# Patient Record
Sex: Female | Born: 1945 | Race: White | Hispanic: No | Smoking: Never smoker
Health system: Southern US, Community
[De-identification: ages and names within clinical notes are randomized; demographics above are authoritative.]

## PROBLEM LIST (undated history)

## (undated) DIAGNOSIS — I7389 Other specified peripheral vascular diseases: Secondary | ICD-10-CM

## (undated) DIAGNOSIS — E039 Hypothyroidism, unspecified: Secondary | ICD-10-CM

## (undated) DIAGNOSIS — R0789 Other chest pain: Secondary | ICD-10-CM

## (undated) DIAGNOSIS — E7800 Pure hypercholesterolemia, unspecified: Secondary | ICD-10-CM

## (undated) DIAGNOSIS — K219 Gastro-esophageal reflux disease without esophagitis: Secondary | ICD-10-CM

---
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
## 1997-07-25 ENCOUNTER — Ambulatory Visit (HOSPITAL_COMMUNITY): Admission: RE | Admit: 1997-07-25 | Discharge: 1997-07-25 | Payer: Self-pay | Admitting: *Deleted

## 1997-09-01 ENCOUNTER — Ambulatory Visit (HOSPITAL_BASED_OUTPATIENT_CLINIC_OR_DEPARTMENT_OTHER): Admission: RE | Admit: 1997-09-01 | Discharge: 1997-09-01 | Payer: Self-pay | Admitting: Surgery

## 1997-09-25 ENCOUNTER — Ambulatory Visit (HOSPITAL_COMMUNITY): Admission: RE | Admit: 1997-09-25 | Discharge: 1997-09-26 | Payer: Self-pay | Admitting: Surgery

## 1998-02-03 ENCOUNTER — Ambulatory Visit (HOSPITAL_COMMUNITY): Admission: RE | Admit: 1998-02-03 | Discharge: 1998-02-03 | Payer: Self-pay | Admitting: *Deleted

## 1998-08-26 ENCOUNTER — Observation Stay (HOSPITAL_COMMUNITY): Admission: RE | Admit: 1998-08-26 | Discharge: 1998-08-27 | Payer: Self-pay | Admitting: Urology

## 1998-10-01 ENCOUNTER — Encounter: Payer: Self-pay | Admitting: *Deleted

## 1998-10-01 ENCOUNTER — Ambulatory Visit (HOSPITAL_COMMUNITY): Admission: RE | Admit: 1998-10-01 | Discharge: 1998-10-01 | Payer: Self-pay | Admitting: *Deleted

## 1999-10-04 ENCOUNTER — Ambulatory Visit (HOSPITAL_COMMUNITY): Admission: RE | Admit: 1999-10-04 | Discharge: 1999-10-04 | Payer: Self-pay | Admitting: *Deleted

## 1999-10-04 ENCOUNTER — Encounter: Payer: Self-pay | Admitting: *Deleted

## 1999-12-25 ENCOUNTER — Encounter: Admission: RE | Admit: 1999-12-25 | Discharge: 1999-12-25 | Payer: Self-pay | Admitting: Internal Medicine

## 1999-12-25 ENCOUNTER — Encounter: Payer: Self-pay | Admitting: Internal Medicine

## 2000-10-16 ENCOUNTER — Ambulatory Visit (HOSPITAL_COMMUNITY): Admission: RE | Admit: 2000-10-16 | Discharge: 2000-10-16 | Payer: Self-pay | Admitting: *Deleted

## 2000-10-16 ENCOUNTER — Encounter: Payer: Self-pay | Admitting: *Deleted

## 2000-11-17 ENCOUNTER — Encounter: Admission: RE | Admit: 2000-11-17 | Discharge: 2000-11-17 | Payer: Self-pay | Admitting: Internal Medicine

## 2000-11-17 ENCOUNTER — Encounter: Payer: Self-pay | Admitting: Internal Medicine

## 2000-12-13 ENCOUNTER — Observation Stay (HOSPITAL_COMMUNITY): Admission: RE | Admit: 2000-12-13 | Discharge: 2000-12-13 | Payer: Self-pay | Admitting: Neurosurgery

## 2000-12-13 ENCOUNTER — Encounter: Payer: Self-pay | Admitting: Neurosurgery

## 2001-11-07 ENCOUNTER — Encounter: Admission: RE | Admit: 2001-11-07 | Discharge: 2001-11-07 | Payer: Self-pay | Admitting: Internal Medicine

## 2001-11-07 ENCOUNTER — Encounter: Payer: Self-pay | Admitting: Internal Medicine

## 2001-12-17 ENCOUNTER — Inpatient Hospital Stay (HOSPITAL_COMMUNITY): Admission: RE | Admit: 2001-12-17 | Discharge: 2001-12-20 | Payer: Self-pay | Admitting: Neurosurgery

## 2001-12-17 ENCOUNTER — Encounter: Payer: Self-pay | Admitting: Neurosurgery

## 2002-01-17 ENCOUNTER — Encounter: Payer: Self-pay | Admitting: Neurosurgery

## 2002-01-17 ENCOUNTER — Encounter: Admission: RE | Admit: 2002-01-17 | Discharge: 2002-01-17 | Payer: Self-pay | Admitting: Neurosurgery

## 2002-04-04 ENCOUNTER — Encounter: Admission: RE | Admit: 2002-04-04 | Discharge: 2002-04-04 | Payer: Self-pay | Admitting: Neurosurgery

## 2002-04-04 ENCOUNTER — Encounter: Payer: Self-pay | Admitting: Neurosurgery

## 2003-07-14 ENCOUNTER — Encounter: Admission: RE | Admit: 2003-07-14 | Discharge: 2003-07-14 | Payer: Self-pay | Admitting: Neurosurgery

## 2004-07-29 ENCOUNTER — Ambulatory Visit (HOSPITAL_COMMUNITY): Admission: RE | Admit: 2004-07-29 | Discharge: 2004-07-29 | Payer: Self-pay | Admitting: Internal Medicine

## 2004-08-11 ENCOUNTER — Encounter: Admission: RE | Admit: 2004-08-11 | Discharge: 2004-08-11 | Payer: Self-pay | Admitting: Internal Medicine

## 2004-08-26 ENCOUNTER — Ambulatory Visit (HOSPITAL_COMMUNITY): Admission: RE | Admit: 2004-08-26 | Discharge: 2004-08-26 | Payer: Self-pay | Admitting: Orthopedic Surgery

## 2004-08-26 ENCOUNTER — Ambulatory Visit (HOSPITAL_BASED_OUTPATIENT_CLINIC_OR_DEPARTMENT_OTHER): Admission: RE | Admit: 2004-08-26 | Discharge: 2004-08-27 | Payer: Self-pay | Admitting: Orthopedic Surgery

## 2004-08-27 ENCOUNTER — Encounter (INDEPENDENT_AMBULATORY_CARE_PROVIDER_SITE_OTHER): Payer: Self-pay | Admitting: Specialist

## 2005-11-30 ENCOUNTER — Encounter: Admission: RE | Admit: 2005-11-30 | Discharge: 2005-11-30 | Payer: Self-pay | Admitting: Internal Medicine

## 2006-01-03 ENCOUNTER — Encounter: Admission: RE | Admit: 2006-01-03 | Discharge: 2006-01-03 | Payer: Self-pay | Admitting: Internal Medicine

## 2006-01-26 ENCOUNTER — Encounter: Admission: RE | Admit: 2006-01-26 | Discharge: 2006-01-26 | Payer: Self-pay | Admitting: Internal Medicine

## 2006-06-15 ENCOUNTER — Ambulatory Visit (HOSPITAL_COMMUNITY): Admission: RE | Admit: 2006-06-15 | Discharge: 2006-06-15 | Payer: Self-pay | Admitting: Internal Medicine

## 2007-01-23 ENCOUNTER — Encounter: Admission: RE | Admit: 2007-01-23 | Discharge: 2007-01-23 | Payer: Self-pay | Admitting: Neurosurgery

## 2007-01-26 ENCOUNTER — Encounter: Admission: RE | Admit: 2007-01-26 | Discharge: 2007-01-26 | Payer: Self-pay | Admitting: Neurosurgery

## 2007-03-20 ENCOUNTER — Inpatient Hospital Stay (HOSPITAL_COMMUNITY): Admission: RE | Admit: 2007-03-20 | Discharge: 2007-03-22 | Payer: Self-pay | Admitting: Neurosurgery

## 2007-04-03 ENCOUNTER — Encounter: Admission: RE | Admit: 2007-04-03 | Discharge: 2007-04-03 | Payer: Self-pay | Admitting: Neurosurgery

## 2007-07-20 ENCOUNTER — Encounter: Admission: RE | Admit: 2007-07-20 | Discharge: 2007-07-20 | Payer: Self-pay | Admitting: Neurosurgery

## 2008-10-16 IMAGING — CR DG CHEST 2V
2 series · 2 of 2 positions shown · non-contrast
Comparison: None

This report is delayed due to PACS failure.
CLINICAL DATA: 61-year-old female, degenerative disk disease.  Preop for surgery.
 CHEST ? 2 VIEW ? 03/20/07:

[view not recorded (1 of 2)]
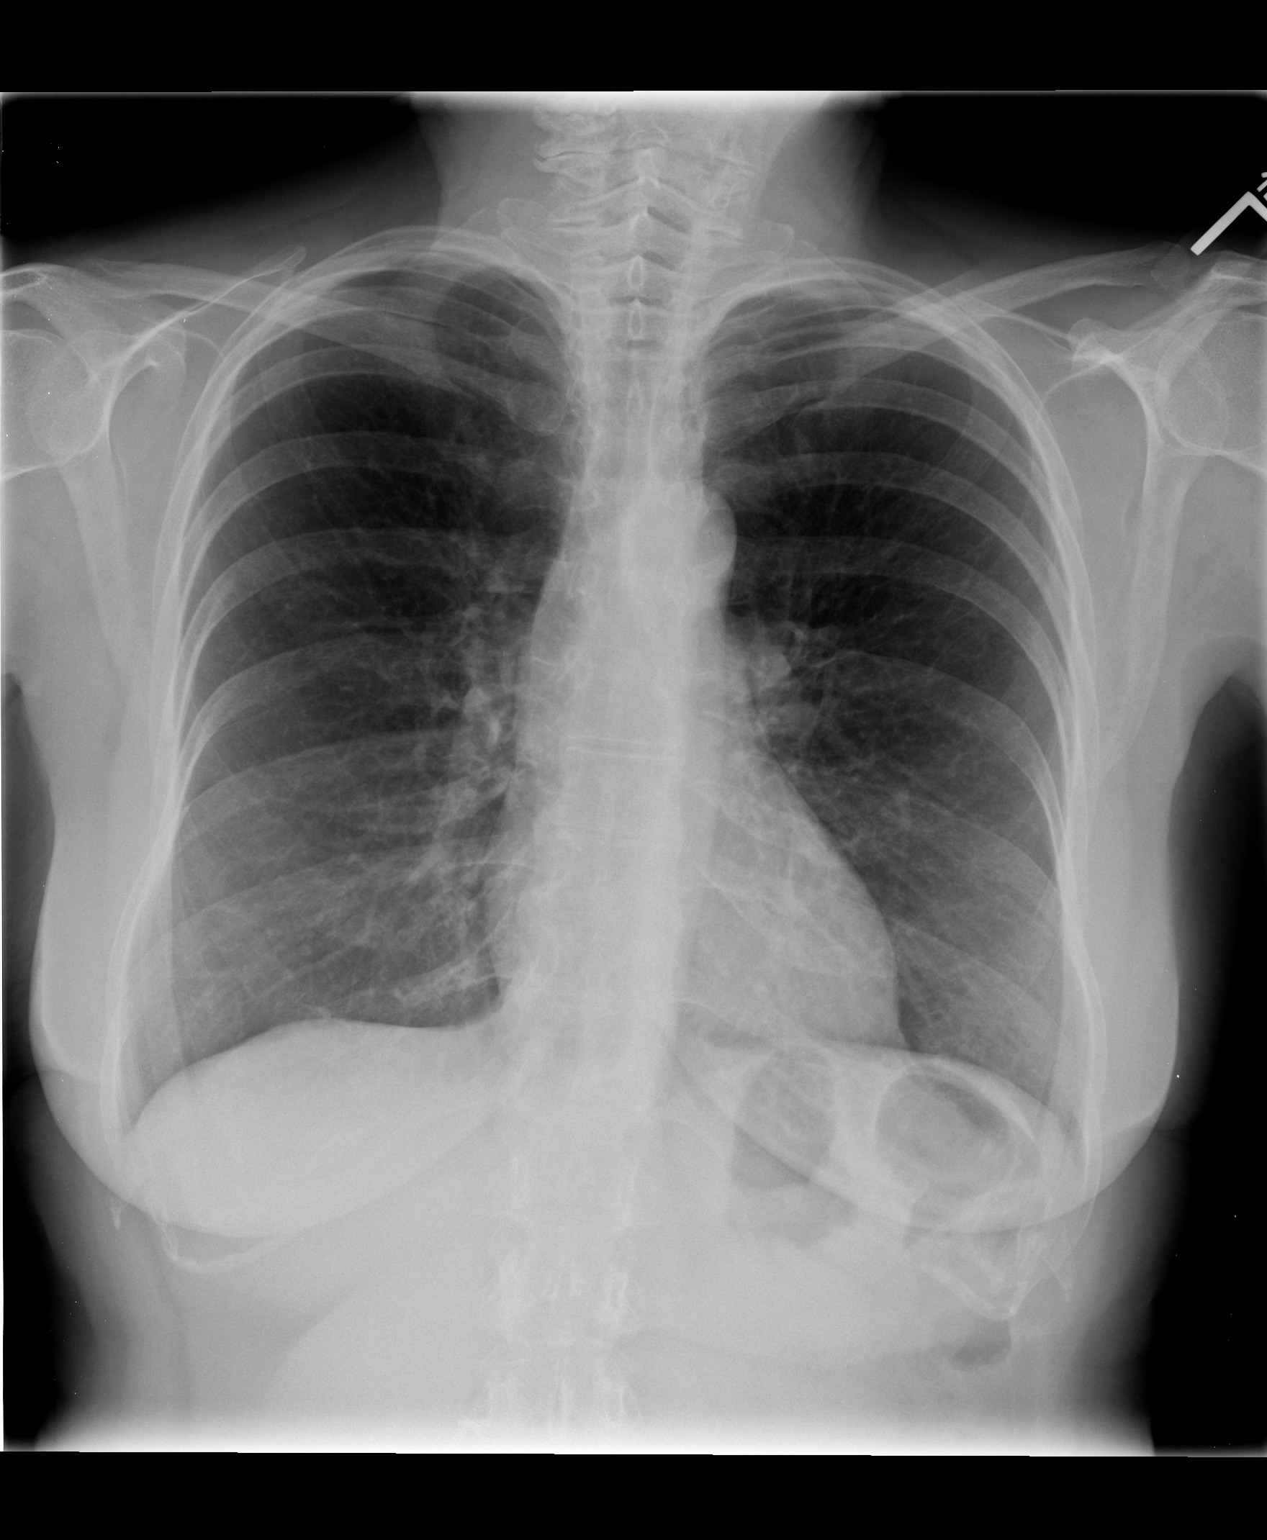

[view not recorded (2 of 2)]
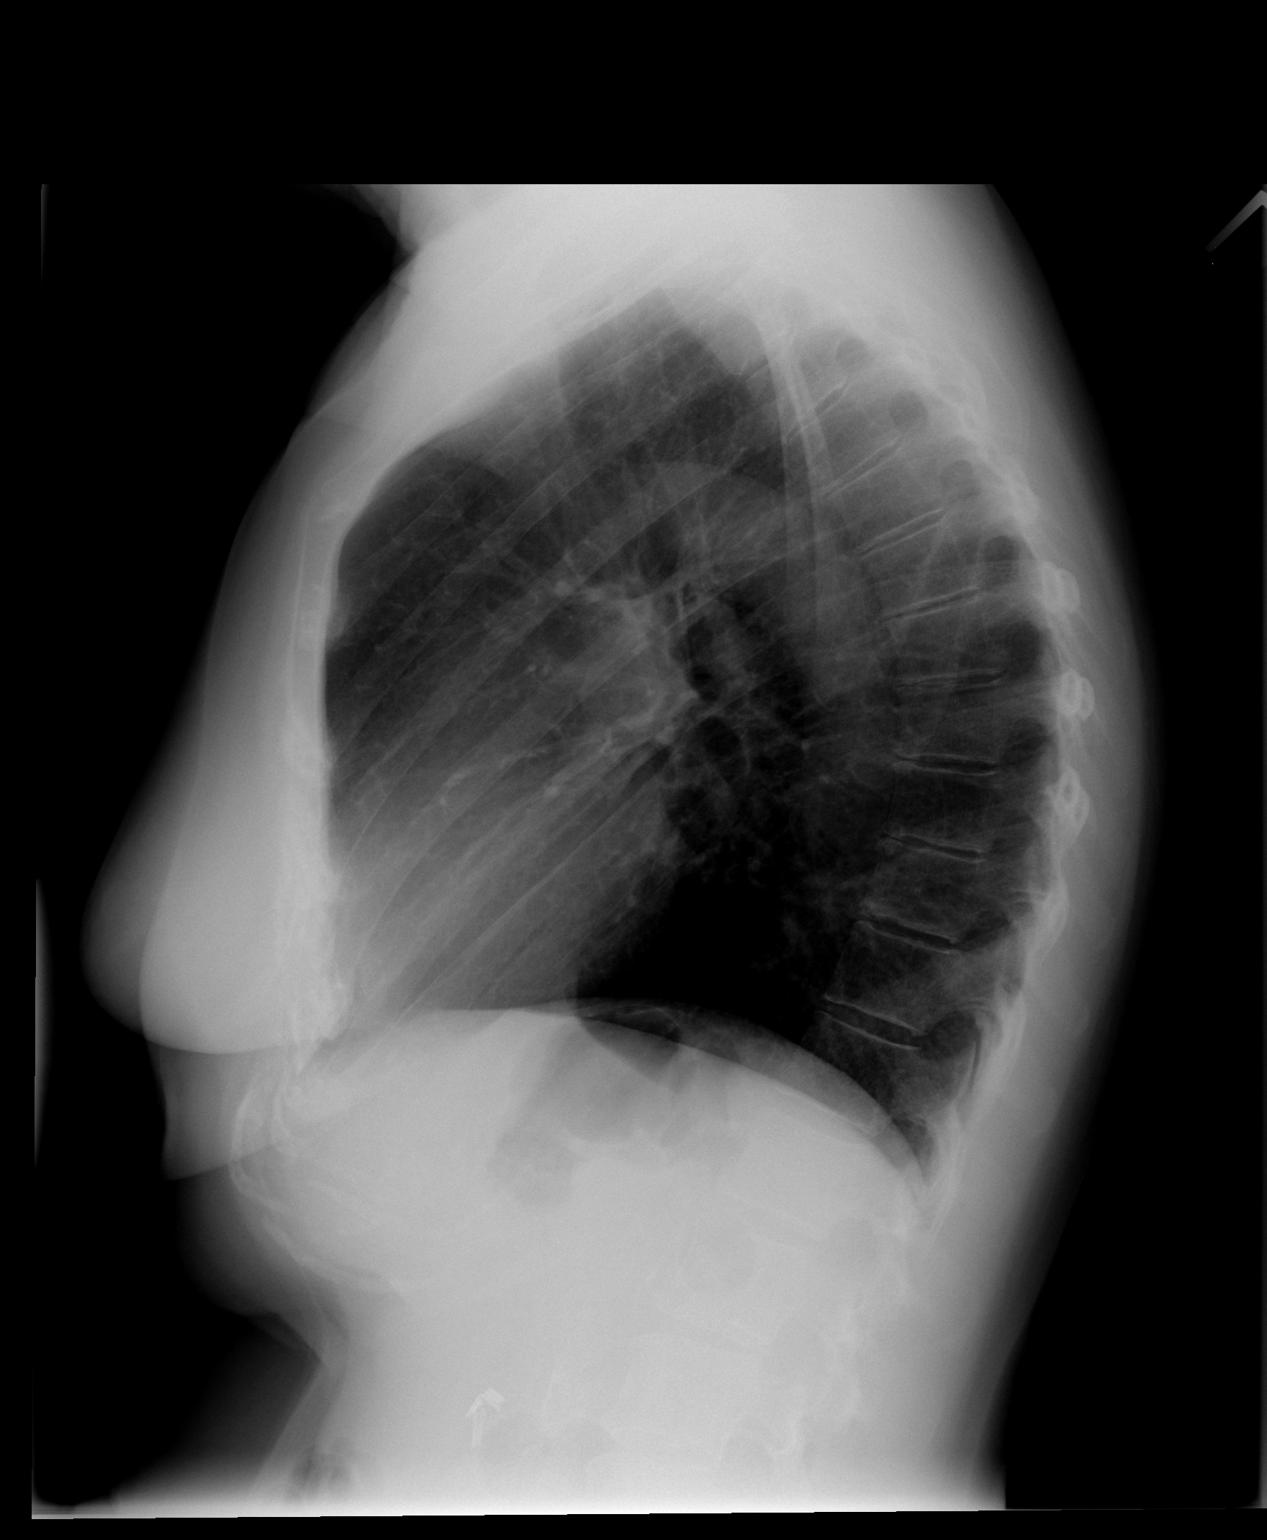

[2 of 2 positions shown; findings below may reference images not displayed]

FINDINGS: Cardio-pericardial silhouette is within normal limits for size.  The lungs are mildly hyperexpanded in the AP dimension.  No focal airspace disease present.  The visualized soft tissues and bony thorax are unremarkable.  The patient is status post cholecystectomy.
IMPRESSION: 1.  Mild COPD.
 2.  No acute cardiopulmonary disease.

## 2008-10-16 IMAGING — CR DG CHEST 1V PORT
1 series · 1 of 1 positions shown · non-contrast
Comparison: Earlier that day.

This report is delayed due to PACS failure.
CLINICAL DATA: Central line placement. 
 PORTABLE CHEST - 1 VIEW ? 03/20/07 AT 5556 HOURS:

[view not recorded]
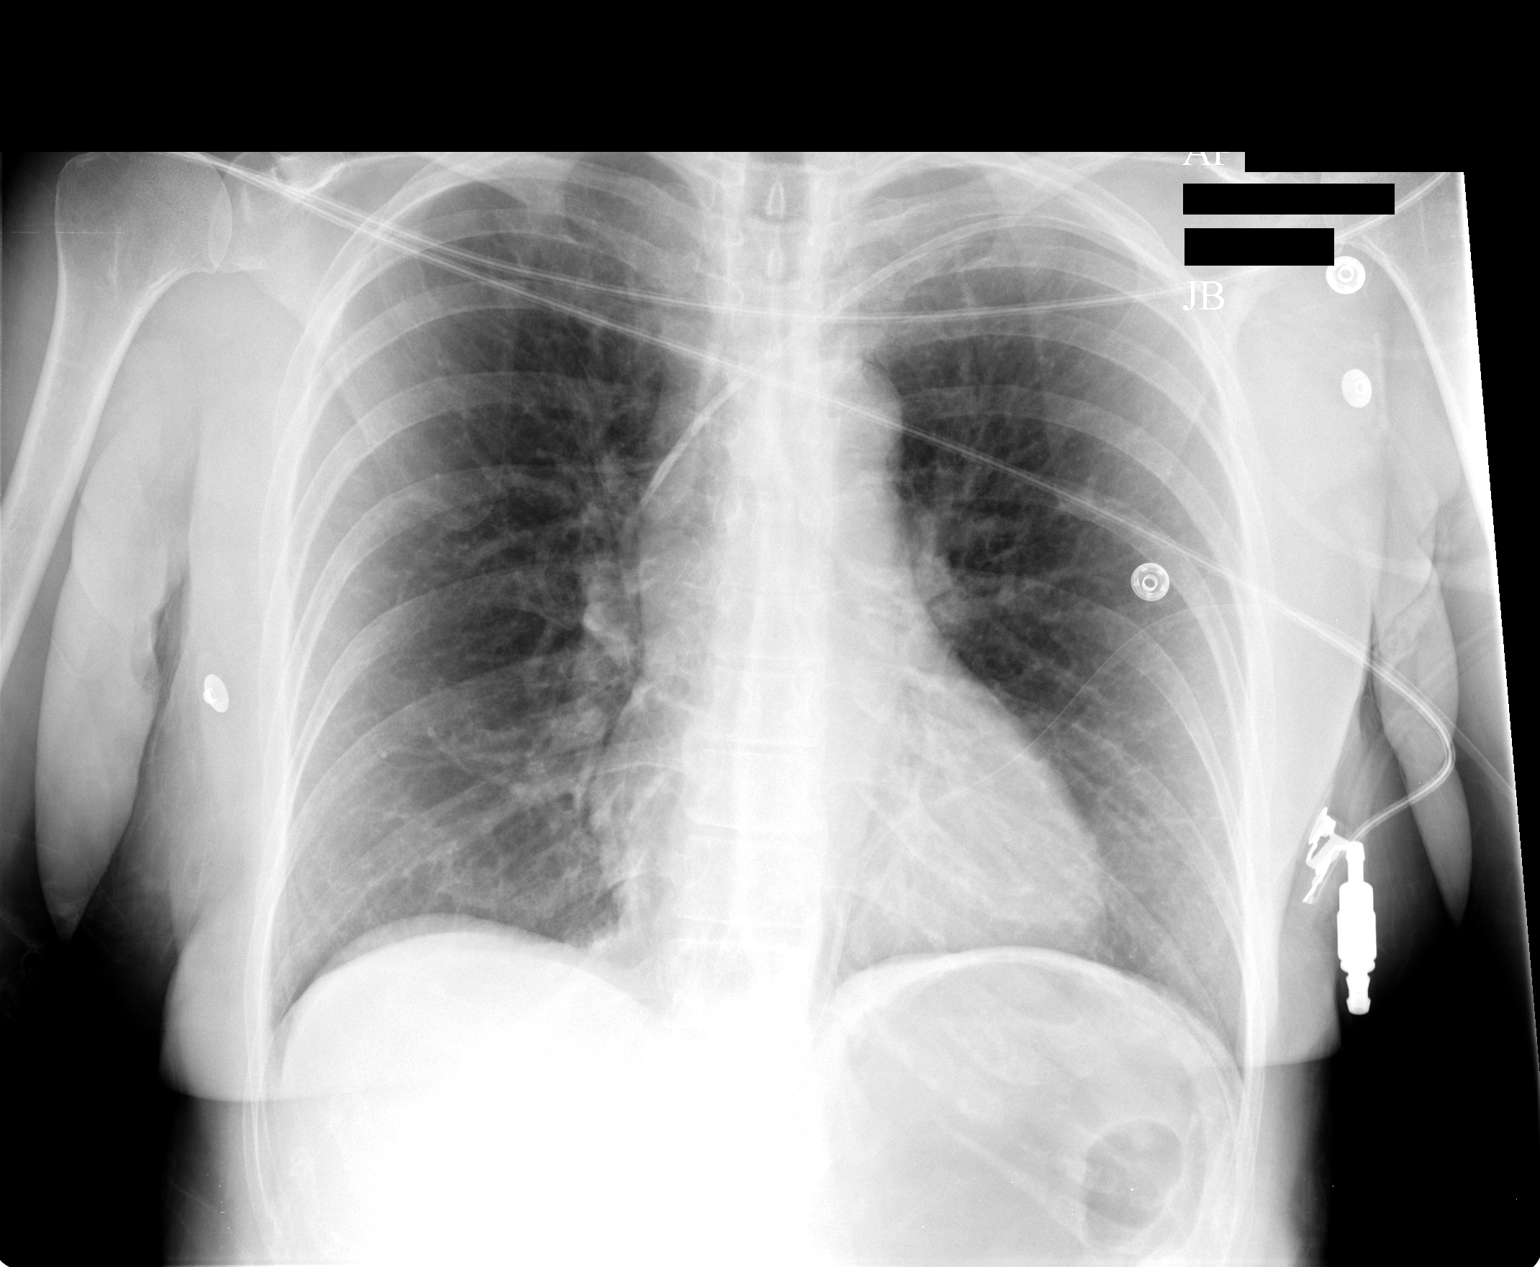

[1 of 1 positions shown; findings below may reference images not displayed]

FINDINGS: A left central venous line is present with the tip in the mid-SVC.  No pneumothorax is seen.  The lungs are clear.   The heart is within normal limits in size.
IMPRESSION: Left central venous catheter tip in mid-SVC.  No pneumothorax.

## 2008-11-03 ENCOUNTER — Ambulatory Visit (HOSPITAL_COMMUNITY): Admission: RE | Admit: 2008-11-03 | Discharge: 2008-11-03 | Payer: Self-pay | Admitting: Internal Medicine

## 2010-04-11 ENCOUNTER — Encounter: Payer: Self-pay | Admitting: Internal Medicine

## 2010-08-03 NOTE — Op Note (Signed)
Kimberly Rosario, Kimberly Rosario              ACCOUNT NO.:  [id number]   MEDICAL RECORD NO.:  [id number]          PATIENT TYPE:  INP   LOCATION:  3172                         FACILITY:  [facility]   PHYSICIAN:  Kathaleen Maser. Pool, M.D.    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  Status post L5-S1 decompression and fusion with  instrumentation.   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  Status post L5-S1 decompression and fusion with  instrumentation.   PROCEDURE NOTE:  Re-exploration of L5-S1 fusion with removal of L5-S1  instrumentation.  L4-5 redo decompressive laminectomy with bilateral L4  and L5 decompressive foraminotomies, more than would be required for  simple interbody fusion alone.  L4-5 posterior lumbar body fusion  utilizing tangent interbody allograft wedge, __________ interbody PEEK  cage and local autografting.  L4-5 posterolateral arthrodesis utilizing  nonsegmental pedicle sensation and local autograft.   SURGEON:  Kathaleen Maser. Pool, M.D.   ASSISTANT SURGEONS:  Reinaldo Meeker, M.D.   ANESTHESIA:  General endotracheal.   INDICATIONS FOR PROCEDURE:  The patient is a 65 year old female with  history of progressive back and bilateral lower extremity pain failing  all conservative management.  The patient is status post previous L5-S1  decompression and fusion for spondylolisthesis with reasonably good  results.  The patient has evidence of segmental breakdown at the L4-5  level with resultant stenosis and instability.  The patient presents now  for L4-5 decompression and fusion with instrumentation.  This will  require re-exploring her L5-S1 fusion and removing hardware at this  level.  The patient is aware of the risks and benefits and wishes to  proceed.   DESCRIPTION  OF PROCEDURE:  The patient was placed in the supine  position.  After a level of anesthesia was achieved the patient was  placed prone onto the Wilson frame, appropriate padding placed.  The  lumbar area was then prepped and draped in the usual sterile fashion.  Made a 10 cm linear  incision overlying the L4-5 and S1 levels.  This  was carried down sharply in the midline.  Subperiosteal dissection was  performed exposing lamina of L3-L4, as well as the previous  posterolateral fusion at L5-S1 and its instrumentation.  Deep self-  retaining retractor was placed.  Intraoperative fluoroscopy was used.  Levels were confirmed.  The instrumentation L5-S1 was disconnected.  All  parts were found to be solid.  The fusion itself was explored and found  to be quite solid.  With this in mind the S1 screws were no longer  necessary and these were removed.  Attention was then placed to the L4-5  level.  Using dental instruments and careful dissection the previous  laminectomy defect was dissected free.  Complete laminectomy of L4 was  then undertaken using Leksell rongeurs, Kerrison rongeurs and a high-  speed drill.  All elements of the lamina of L4 were removed.  Inferior  facetectomies was performed at L4 bilaterally.  Superior facetectomies  at five were performed bilaterally.  Wide decompressive foraminotomies  were then performed along the course of exiting L4-L5 nerve roots  bilaterally.  All bone is cleaned and used in later autografting.  Epidural venous plexus was coagulated and cut.  Starting first on the  patient's right side the thecal sac and nerve roots were gently  mobilized and tracked towards the midline.  Disk space was incised with  a 15 blade, retracted the fascia.  Wide disk space clean-out was  achieved.  Using pituitary rongeurs __________ and Epstein curettes.  All elements of the disk were removed at this level on the right side.  The procedure was then repeated on the  patient's left side.  The disc  space was then sequentially dilated up to 8 mm and an 8-mm distractor  was left on the patient's right side.  Thecal sac nerve roots protected  on the left side.  The disk space was then reamed and cut with 8-mm  tangent instruments on the left side.  Soft tissues were removed from  the interspace.  An 8 x 26-mm tangent wedge was then impacted into place  and recessed approximately 2 mm from the posterior cortical  margin of  L4.  Distractors were removed from the patient's right side.  Thecal sac  nerve was checked on the right side.  Disk space was then reamed and cut  with 8-mm tangent instruments on the right side.  Soft tissue was  removed from the interspace.  Disk space further curettaged.  Morselized  autograft mixed with__________  putty was then packed in the interspace.  An 8 x 22 mm, __________ cage packed with morselized autograft was then  packed into place and recessed approximately 2 mm of the posterior  cortical margin of L4.  The pedicles of L4 were then identified  bilaterally using surface landmarks and intraoperative fluoroscopy.  Superficial bone around the pedicles was then removed using a high-speed  drill.  Each pedicle was then probed using pedicle awl.  The pedicle awl  track was then tapped with a 5.25-mm screw tap.  Each screw tap hole was  probed and found to be in solid bone.  A 6.75 x 45-mm spiral 90 D screws  were placed bilaterally at L4.  Transverse processes at L4 and L5 were  then decorticated using a high-speed drill.  Morselized autograft was  then packed posterolaterally for later fusion.  Short segment titanium  rods were then placed over the screw heads at L4-L5.  Locking caps were  then placed over the screw heads and the locking caps were given a final  tightening with construct under compression.  Final images revealed good  position of bone grafts__________  .  The wound was then irrigated one  final time.   Gelfoam was placed topically for hemostasis and found to be  good.  A medium Hemovac drain was left in the wound __________  .  The  wound was  closed in layers with Vicryl suture.  Steri-Strips and a  sterile dressings were applied.  There were no complications.  The  patient tolerated the procedure well and she returned to recovery room  postoperatively.           ______________________________  Kathaleen Maser Pool, M.D.     HAP/MEDQ  D:  03/20/2007  T:  03/20/2007  Job:  [id number]

## 2010-08-06 NOTE — Discharge Summary (Signed)
   NAME:  Kimberly Rosario, Kimberly Rosario                        ACCOUNT NO.:  [id number]   MEDICAL RECORD NO.:  [id number]                   PATIENT TYPE:  INP   LOCATION:  3006                                 FACILITY:  [facility]   PHYSICIAN:  Kathaleen Maser. Pool, M.D.                 DATE OF BIRTH:  08-24-1945   DATE OF ADMISSION:  12/17/2001  DATE OF DISCHARGE:  12/20/2001                                 DISCHARGE SUMMARY   SERVICE:  Neurosurgery.   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   POSTOPERATIVE DIAGNOSIS:  Recurrent left L5-S1 synovial cyst with  radiculopathy.   OPERATIONS AND TREATMENT:  Re-exploration at L5-S1 laminotomy with resection  of recurrent synovial cyst; L5-S1 posterior lumbar interbody fusion  utilizing cage, wedges and local autograft; L5-S1 posterolateral fusion  utilizing pedicle screw fixation and local autograft.   HISTORY OF PRESENT ILLNESS:  The patient is a 65 year old female with a  history of back and left lower extremity pain, status post a previous left-  sided L5-S1 laminotomy with resection of a synovial cyst.  MRI scanning  demonstrates a large recurrence in the synovial cyst.  The patient has been  counseled as to her options.  She has decided to proceed with decompression  and fusion surgery for hopeful improvement in her symptoms.   HOSPITAL COURSE:  The patient was taken to the operating room where an  uncomplicated L5-S1 decompression and fusion surgery with instrumentation  were performed.  Postoperatively, the patient did quite well.  Overall, her  extremity pain completely resolved.  Back pain was well-controlled.  She was  gradually mobilized with the assistance of physical therapy.  On her third  postoperative day, she was ready for discharge home.   CONDITION AT DISCHARGE:  Improved.   FOLLOWUP:  Discharge followup is in one week in my office.                                               Henry A. Pool, M.D.    HAP/MEDQ  D:  01/15/2002  T:  01/16/2002  Job:  [id number]

## 2010-08-06 NOTE — Op Note (Signed)
[facility]  Patient:    Kimberly Rosario, Kimberly Rosario Visit Number: [id number] MRN: [id number]          Service Type: SUR Location: 3000 3019 [id number] Attending Physician:  Donn Pierini Dictated by:   Julio Sicks, M.D. Proc. Date: 12/13/00 Admit Date:  12/13/2000 Discharge Date: 12/13/2000                             Operative Report  PREOPERATIVE DIAGNOSIS:  Left L5-S1 synovial cyst with left-sided S1 radiculopathy.  POSTOPERATIVE DIAGNOSIS:  Left L5-S1 synovial cyst with left-sided S1 radiculopathy.  OPERATION PERFORMED:  Left L5-S1 laminotomy with resection of synovial cyst.  SURGEON:  Julio Sicks, M.D.  ASSISTANT:  Donalee Citrin, Montez Hageman.  ANESTHESIA:  General endotracheal.  INDICATIONS FOR PROCEDURE: The patient is a 65 year old female with history of back and left lower extremity pain consistent with a left-sided S1 radiculopathy which had failed conservative management.  MRI scanning demonstrates evidence of a focal left-sided L5-S1 synovial cyst with compression of a left-sided S1 nerve root.  The disk space looked healthy at this level.  There is no evidence of instability on bending films.  The patient has been counseled as to her options.  She has decided to proceed with simple decompression with resection of the cyst.  She realizes that at a later date she may require fusion.  DESCRIPTION OF PROCEDURE:  The patient was taken to the operating room and placed on the operating table in supine position.  After an adequate level of anesthesia was achieved, the patient was positioned prone onto a Wilson frame and appropriately padded.  The patients lumbar region was shaved and prepped sterilely.  A 10 blade was used to make a linear skin incision overlying the L5-S1 interspace.  This was carried down in the midline.  Subperiosteal dissection was performed on the left side exposing the lamina and facet joints at L5 and S1.  The deep self-retaining  retractor was placed.  Intraoperative X-ray was taken and the level was confirmed.  A laminotomy was then performed using a high speed drill and Kerrison rongeurs to remove the inferior one third of the lamina of L5, medial edge of the L5-S1 facet joint and superior rim of the S1 lamina.  Ligamentum flavum was then elevated and resected in piecemeal fashion.  The underlying thecal sac and exiting S1 nerve root were identified.  The microscope was brought into the field and used for microdissection of the left-sided S1 nerve root and overlying synovial cyst. The synovial cyst was gently dissected from the underlying nerve root.  It was then resected in a piecemeal fashion using Kerrison rongeurs.  Decompression then proceeded to the entrance in the left-sided S1 foramen.  All elements of the synovial cyst were completely resected.  The joint capsule and joint space of the L5-S1 facet joint on the left was explored.  All soft tissue was scraped from within the joint space itself.  At this point a very thorough decompression had been performed.  The disk space looked normal.  The wound was then irrigated with antibiotic solution.  Gelfoam was placed topically for hemostasis which was found to be good.  Microscope and retractor system were removed.  Hemostasis in the muscle achieved with electrocautery.  The wound was then closed in layers with Vicryl sutures.  Steri-Strips and sterile dressing were applied.  There were no apparent complications.  The patient tolerated the  procedure well and she returned to the recovery room postoperatively. Dictated by:   Julio Sicks, M.D. Attending Physician:  Donn Pierini DD:  12/13/00 TD:  12/13/00 Job: [id number]

## 2010-08-06 NOTE — Op Note (Signed)
Kimberly Rosario              ACCOUNT NO.:  [id number]   MEDICAL RECORD NO.:  [id number]          PATIENT TYPE:  AMB   LOCATION:  DSC                          FACILITY:  [facility]   PHYSICIAN:  Cindee Salt, M.D.       DATE OF BIRTH:  01-06-1946   DATE OF PROCEDURE:  08/26/2004  DATE OF DISCHARGE:                                 OPERATIVE REPORT   PREOPERATIVE DIAGNOSIS:  CMC arthritis right thumb.   POSTOPERATIVE DIAGNOSIS:  CMC arthritis right thumb.   OPERATION:  Resection trapezium, Ardilaun prosthesis, bone graft cyst,  metacarpal, right thumb.   SURGEON:  Cindee Salt, M.D.   ANESTHESIA:  General.   HISTORY:  The patient is a 65 year old female with a history CMC arthritis.  This has not responded to conservative treatment. She is admitted now for  arthroplasty.   PROCEDURE:  The patient is brought to the operating room where a general  anesthetic was carried out without difficulty. She was prepped using  DuraPrep, supine position, right arm free. A curvilinear incision was made  over the base of the thumb, carried down towards the wrist, carried down  through subcutaneous tissue. The radial artery and nerve were both  identified and protected. Retractors were placed and incision made between  the extensor brevis, abductor longus.  The dissection carried down to the  metacarpal.  Incision was made on the most radial aspect of the metacarpal  for elevation of a flap.  The carpometacarpal joint was identified.  The  trapezium was found to be quite small.  Radial nerve was protected at the  base of the trapezium.  A flap was then created proximally along the  trapezium then over the carpometacarpal joint elevating the joint capsule  along with the periosteum distally over the metacarpal. This was elevated. A  single flap based distally. The joint was then inspected. Significant  arthritic changes were present. An oscillating saw was then used to remove  the most distal aspect  of the trapezium and subchondral bone. Care was taken  to remove the entire bone, taking care protect the radial artery and the  flexor carpi radialis deep in the wound. The entire area was then debrided  of any further osteophytes or loose bone.  A trough was then made proximally  and distally in the trapezium in the base of the metacarpal for acceptance  of the Ardilaun prosthesis after measuring it.  This was then fashioned.  A  large cyst was present in the base of the metacarpal.  The window was taken  from the trapezium and metacarpal base were then morselized after removal of  the cyst wall, which was sent to pathology. The bone was then packed into  the cyst. The Ardilaun prosthesis was then inserted. This was sutured into  position with 2-0 FiberWire and 4-0 Mersilene sutures through drill holes.  The wound was then irrigated. X-rays reveal the prosthesis in good position.  The distal portion of the trapezium was sent to pathology for gross only.  The capsule was then repaired with figure-of-eight 4-0 Mersilene sutures.  Subcutaneous  tissue with 4-0 Vicryl and the skin with interrupted 5-0 nylon  sutures. The thumb lay in excellent position following  insertion.  A sterile compressive dressing thumb spica splint applied. The  patient tolerated the procedure well was taken to the recovery observation  in satisfactory condition. She is admitted for overnight stay for pain  control and antibiotic.  She will be discharged on Percocet.       GK/MEDQ  D:  08/26/2004  T:  08/26/2004  Job:  [id number]

## 2010-08-06 NOTE — Op Note (Signed)
NAME:  Kimberly Rosario, Kimberly Rosario                        ACCOUNT NO.:  [id number]   MEDICAL RECORD NO.:  [id number]                   PATIENT TYPE:  INP   LOCATION:  2899                                 FACILITY:  [facility]   PHYSICIAN:  Kathaleen Maser. Pool, M.D.                 DATE OF BIRTH:  [birth year]   DATE OF PROCEDURE:  12/17/2001  DATE OF DISCHARGE:                                 OPERATIVE REPORT   PREOPERATIVE DIAGNOSES:  Left L5-S1 recurrent synovial cyst with  radiculopathy.   POSTOPERATIVE DIAGNOSES:  Left L5-S1 recurrent synovial cyst with  radiculopathy.   OPERATION PERFORMED:  Re-exploration of L5-S1 laminotomy with resection of  recurrent synovial cyst utilizing microdissection.  L5-S1 posterior lumbar  interbody fusion utilizing tangent wedges and local autograft.  L5-S1  posterolateral fusion utilizing pedicle screw instrumentation and local  autograft.   SURGEON:  Kathaleen Maser. Pool, M.D.   ASSISTANT:  Reinaldo Meeker, M.D.   ANESTHESIA:  General endotracheal.   INDICATIONS FOR PROCEDURE:  The patient is a 65 year old female who has a  history of a previous left-sided L5-S1 synovial cyst with radiculopathy.  This was treated with simple decompression and excision of cyst.  The  patient did well for two to three years but now has recurrent back and left  lower extremity symptoms consistent with a left-sided S1 radiculopathy.  MRI  demonstrates evidence of recurrence of her synovial cyst with marked left-  sided S1 nerve root compression.  We discussed options available for  treatment.  I believe the cyst is a representation of continued instability  within her lumbar spine.  I have recommended decompression and fusion  surgery in hopes of improving her symptoms.  The patient is aware of the  risks and benefits and wishes to proceed.   DESCRIPTION OF PROCEDURE:  The patient was taken to the operating room and  placed on the table in the supine position.  After adequate level  of  anesthesia was achieved, the patient was positioned prone onto a Wilson  frame and appropriately padded.  The patient's lumbar region was prepped and  draped sterilely.  A 10 blade was used to make a linear skin incision  overlying the L5-S1 level.  This was carried down sharply in the midline.  A  subperiosteal dissection was then performed exposing the lamina and facet  joints of L4, L5 and S1.  Deep self-retaining retractor was placed.  The  transverse processes of L5 and the sacral ala were then dissected free  bilaterally.  Intraoperative fluoroscopy was used and the L5-S1 level was  confirmed.  Complete laminectomy was then performed at L5 using Kerrison  rongeurs and Leksell rongeurs and a high speed drill to remove the entire  lamina of L5.  All elements of the previous laminotomy were dissected free  first.  All bone was cleaned and used in later autografting.  Complete  inferior  facetectomy was performed at L5 bilaterally.  Once again, the bone  was cleaned and used in later autografting.  Partial superior facetectomies  at L1 were performed bilaterally.  The ligamentum flavum was then elevated  and resected in piecemeal fashion using Kerrison rongeurs.  The underlying  thecal sac and exiting L5 and S1 nerve roots were identified on the right.  On the left side there was dense postoperative scar and recurrent synovial  cyst was dissected free using dental instruments using microscope for  microdissection.  The dental instruments were used to fully resect the  synovial cyst.  A good plane between cyst and the dura was made and the cyst  was resected in toto.  The underlying thecal sac and nerve roots were well  decompressed at this point.  The disk space at L5 and S1 was then isolated.  Starting first on the left side with nerve roots ____________ disk space  incised with a 15 blade in rectangular fashion.  A wide disk space cleanout  was then achieved pituitary rongeurs and  upward angled pituitary rongeurs  and Epstein curets.  The procedure was then repeated on the contralateral  side again without complication.  The disk space was then sequentially  dilated up to 10 mm with a 10 mm distractor left on the patient's right  side.  The thecal sac and nerve roots were protected on the left side.  The  disk space was then cut with a 10 mm box cutter and then cut with a 10 mm  chisel.  The soft tissue was removed from the interspace.  A 10 x 26 mm  tangent wedge was then impacted into place, recessed approximately 2 mm from  the posterior cortical margin.  The distractor was removed from the  contralateral side.  Images  revealed good position of the bone graft.  With  the nerve roots protected on the right side, the disk space was then reamed  and then cut with a 10 mm chisel once again.  Soft tissue was removed.  The  interspace was curettaged.  Morselized autograft was packed in the  interspace and then a second 10 x 24 mm tangent wedge was then placed on the  patient's right side.  Intraoperative fluoroscopy revealed good position of  both bone grafts.  Pedicles at L5 and S1 were then isolated using surface  landmarks and intraoperative fluoroscopy.  Superficial bone overlying the  pedicle was removed using the high speed drill.  Each pedicle was then  probed using the pedicle awl.  Each pedicle awl track was found to be  solidly within bone.  Each pedicle awl track was then tapped with 5.25 mm  screw tap.  Each screw tap hole was found to be solidly within the bone.  At  L5 6.75 x 40 mm spiral 90 screws were placed bilaterally.  At S1, 6.75 x 30  mm screws were placed on the left and 35 mm screws were placed on the right.  All four screws were given a final tightening and found to be solidly within  bone.  The transverse processes and sacral ala were then decorticated using  a high speed drill.  Morselized autograft was packed posterolaterally.  A short  segment of titanium rod was then placed over the screw heads at L5 and  S1.  Locking caps were then placed over the screw heads.  The locking caps  were then engaged in sequential fashion with the screw heads under  compression.  Final x-rays revealed good position of bone graft and hardware  at the proper operative level with normal alignment of the spine.  Blunt  probe was passed easily along the course of the exiting nerve roots.  There  was no evidence of injury to thecal sac or nerve roots.  Gelfoam was placed  topically for hemostasis.  A medium Hemovac drain was left in the epidural  space.  The wound was then closed in layers with Vicryl sutures.  Steri-  Strips and sterile dressing were applied.  There were no apparent  complications.  The patient tolerated the procedure well and returned to the  recovery room postoperatively.                                                Henry A. Pool, M.D.    HAP/MEDQ  D:  12/17/2001  T:  12/17/2001  Job:  [id number]

## 2010-12-24 LAB — CBC
HCT: 40.8
MCV: 87.9
RDW: 14
WBC: 6

## 2010-12-24 LAB — ABO/RH: ABO/RH(D): O POS

## 2010-12-24 LAB — TYPE AND SCREEN: Antibody Screen: NEGATIVE

## 2010-12-24 LAB — DIFFERENTIAL
Basophils Absolute: 0.1
Monocytes Relative: 12
Neutrophils Relative %: 53

## 2011-01-27 ENCOUNTER — Other Ambulatory Visit: Payer: Self-pay | Admitting: Neurosurgery

## 2011-01-27 DIAGNOSIS — M545 Low back pain: Secondary | ICD-10-CM

## 2011-01-31 ENCOUNTER — Ambulatory Visit
Admission: RE | Admit: 2011-01-31 | Discharge: 2011-01-31 | Disposition: A | Discharge status: Home / Self Care | Payer: BC Managed Care – PPO | Source: Ambulatory Visit | Attending: Neurosurgery | Admitting: Neurosurgery

## 2011-01-31 DIAGNOSIS — M545 Low back pain: Secondary | ICD-10-CM

## 2011-02-07 ENCOUNTER — Ambulatory Visit
Admission: RE | Admit: 2011-02-07 | Discharge: 2011-02-07 | Disposition: A | Discharge status: Home / Self Care | Payer: BC Managed Care – PPO | Source: Ambulatory Visit | Attending: Neurosurgery | Admitting: Neurosurgery

## 2011-02-07 ENCOUNTER — Other Ambulatory Visit: Payer: Self-pay | Admitting: Neurosurgery

## 2011-02-07 DIAGNOSIS — M545 Low back pain: Secondary | ICD-10-CM

## 2011-10-03 ENCOUNTER — Encounter: Payer: Self-pay | Admitting: Gastroenterology

## 2011-10-03 ENCOUNTER — Other Ambulatory Visit: Payer: Self-pay | Admitting: Internal Medicine

## 2011-10-03 DIAGNOSIS — R634 Abnormal weight loss: Secondary | ICD-10-CM

## 2011-10-10 ENCOUNTER — Ambulatory Visit
Admission: RE | Admit: 2011-10-10 | Discharge: 2011-10-10 | Disposition: A | Discharge status: Home / Self Care | Payer: BC Managed Care – PPO | Source: Ambulatory Visit | Attending: Internal Medicine | Admitting: Internal Medicine

## 2011-10-10 DIAGNOSIS — R634 Abnormal weight loss: Secondary | ICD-10-CM

## 2011-10-10 MED ORDER — IOHEXOL 300 MG/ML  SOLN
100.0000 mL | Freq: Once | INTRAMUSCULAR | Status: AC | PRN
  Administered 2011-10-10: 100 mL via INTRAVENOUS

## 2011-11-24 ENCOUNTER — Encounter: Payer: BC Managed Care – PPO | Admitting: Gastroenterology

## 2012-09-05 IMAGING — CR DG LUMBAR SPINE COMPLETE 4+V
4 series · 4 of 4 positions shown · non-contrast
Comparison: MRI of 01/31/2011.  Plain films of 01/03/2011.

CLINICAL DATA: Surgery 4 years ago.  Low back and right leg pain.

LUMBAR SPINE - COMPLETE 4+ VIEW

[view not recorded (1 of 4)]
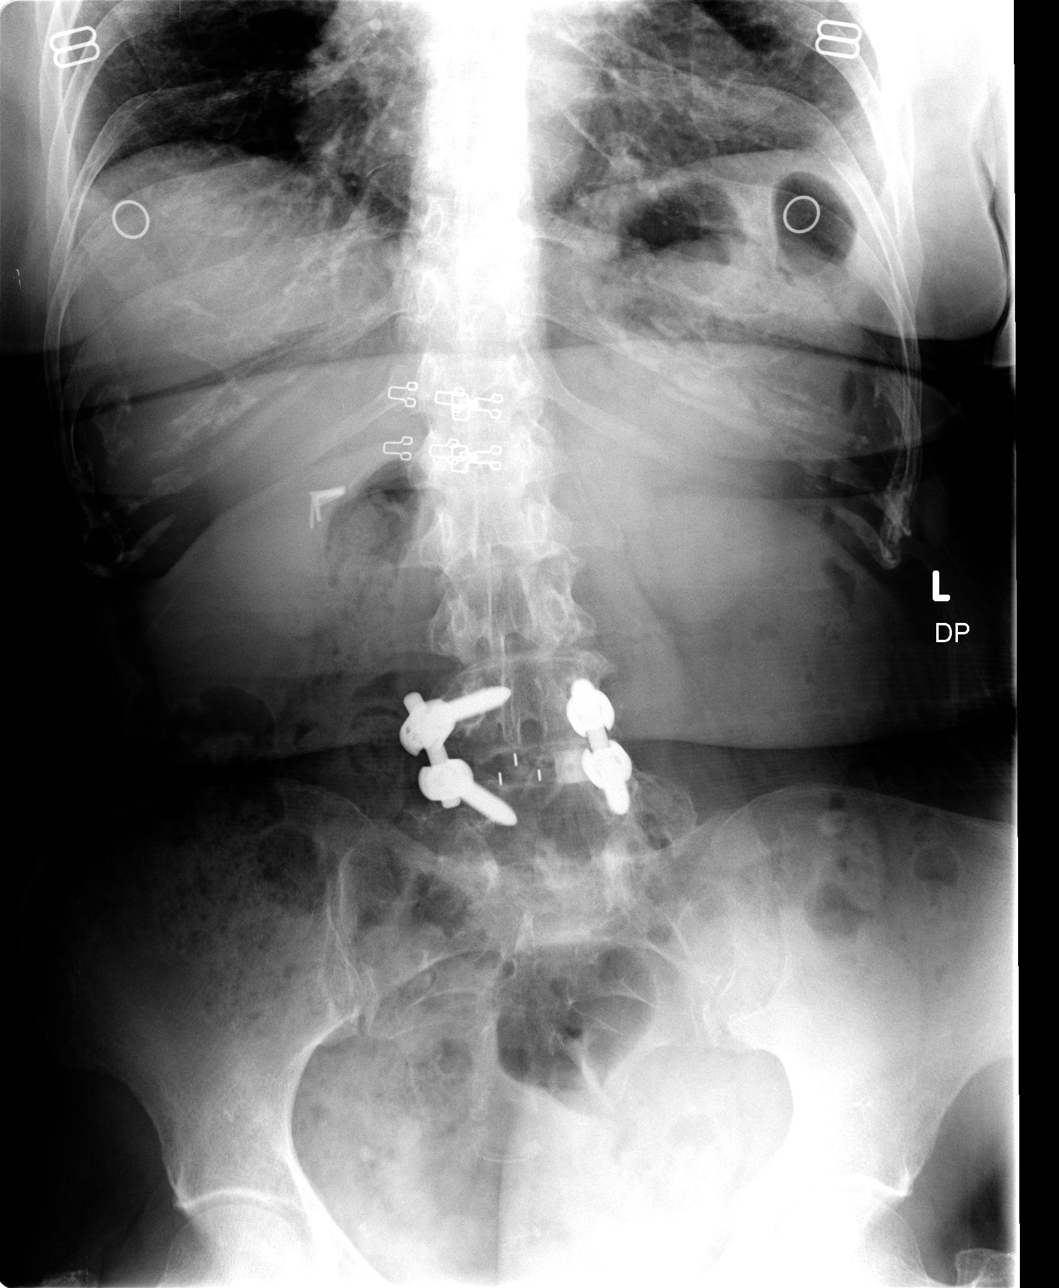

[view not recorded (2 of 4)]
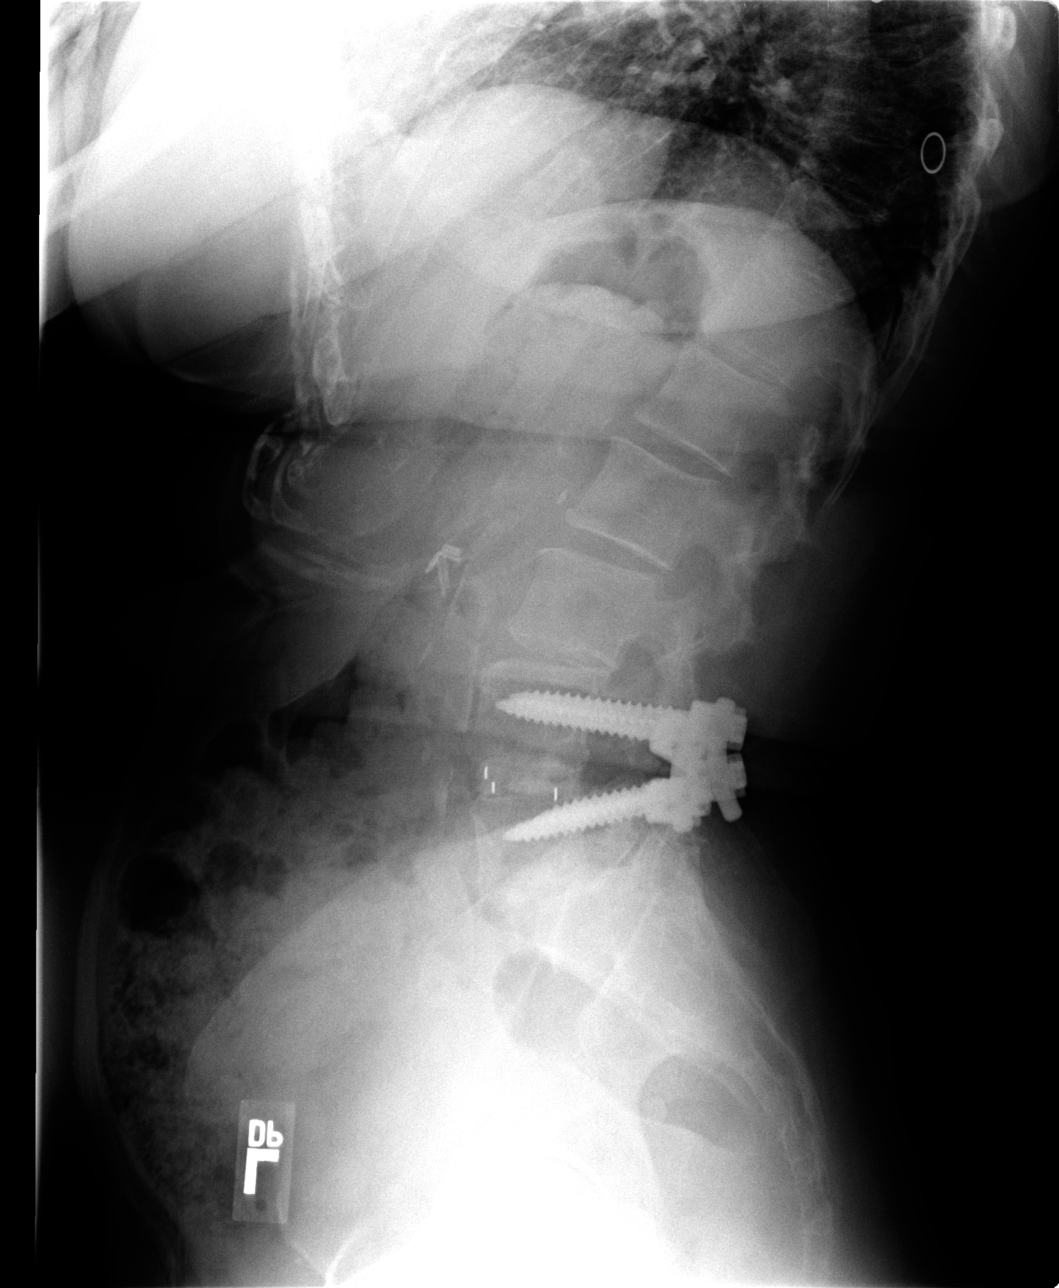

[view not recorded (3 of 4)]
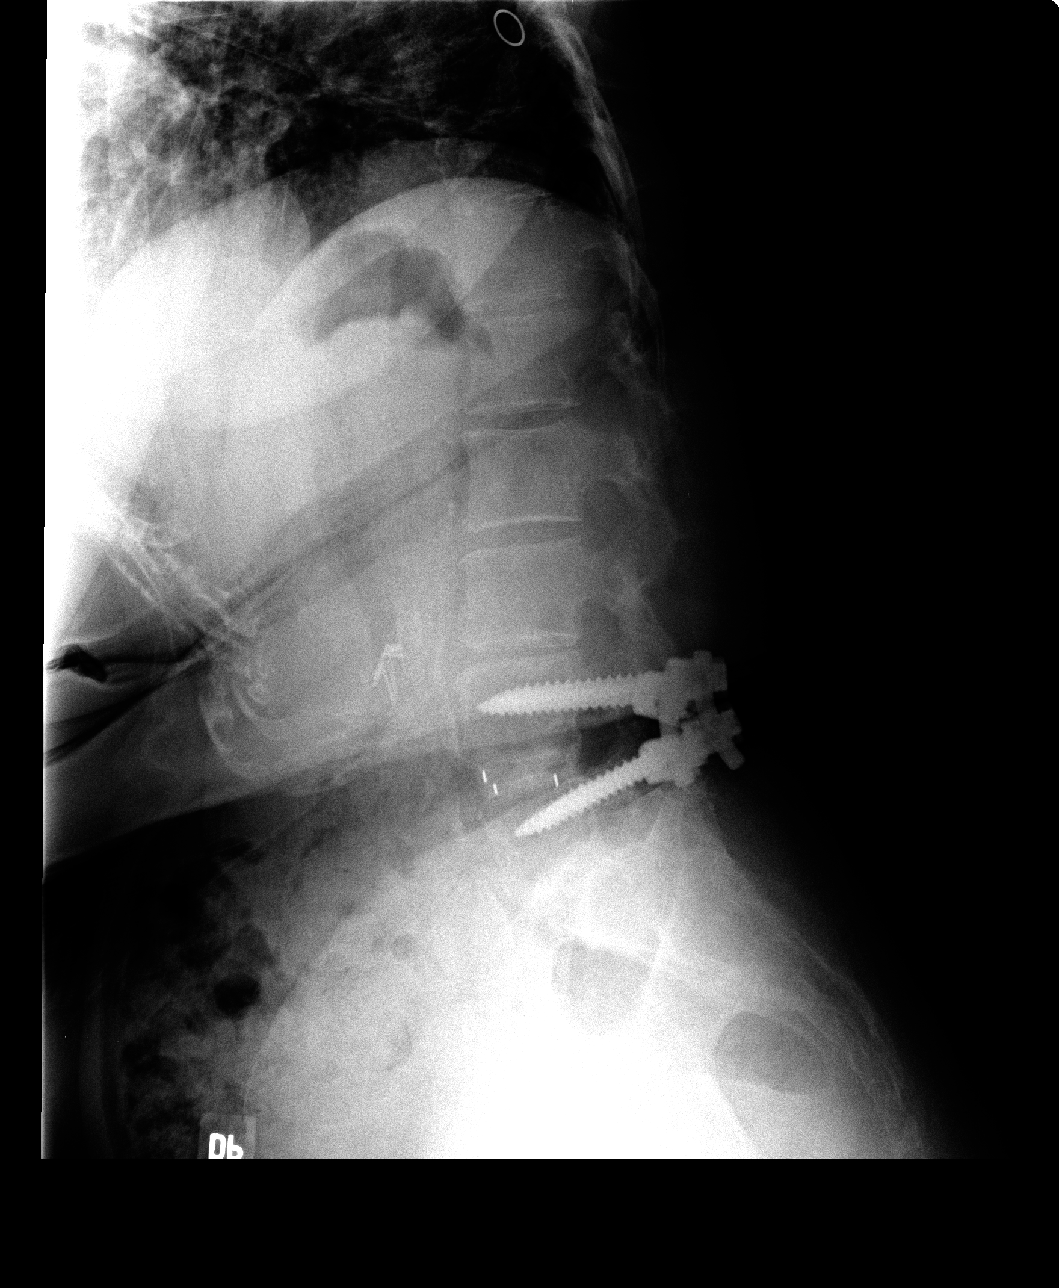

[view not recorded (4 of 4)]
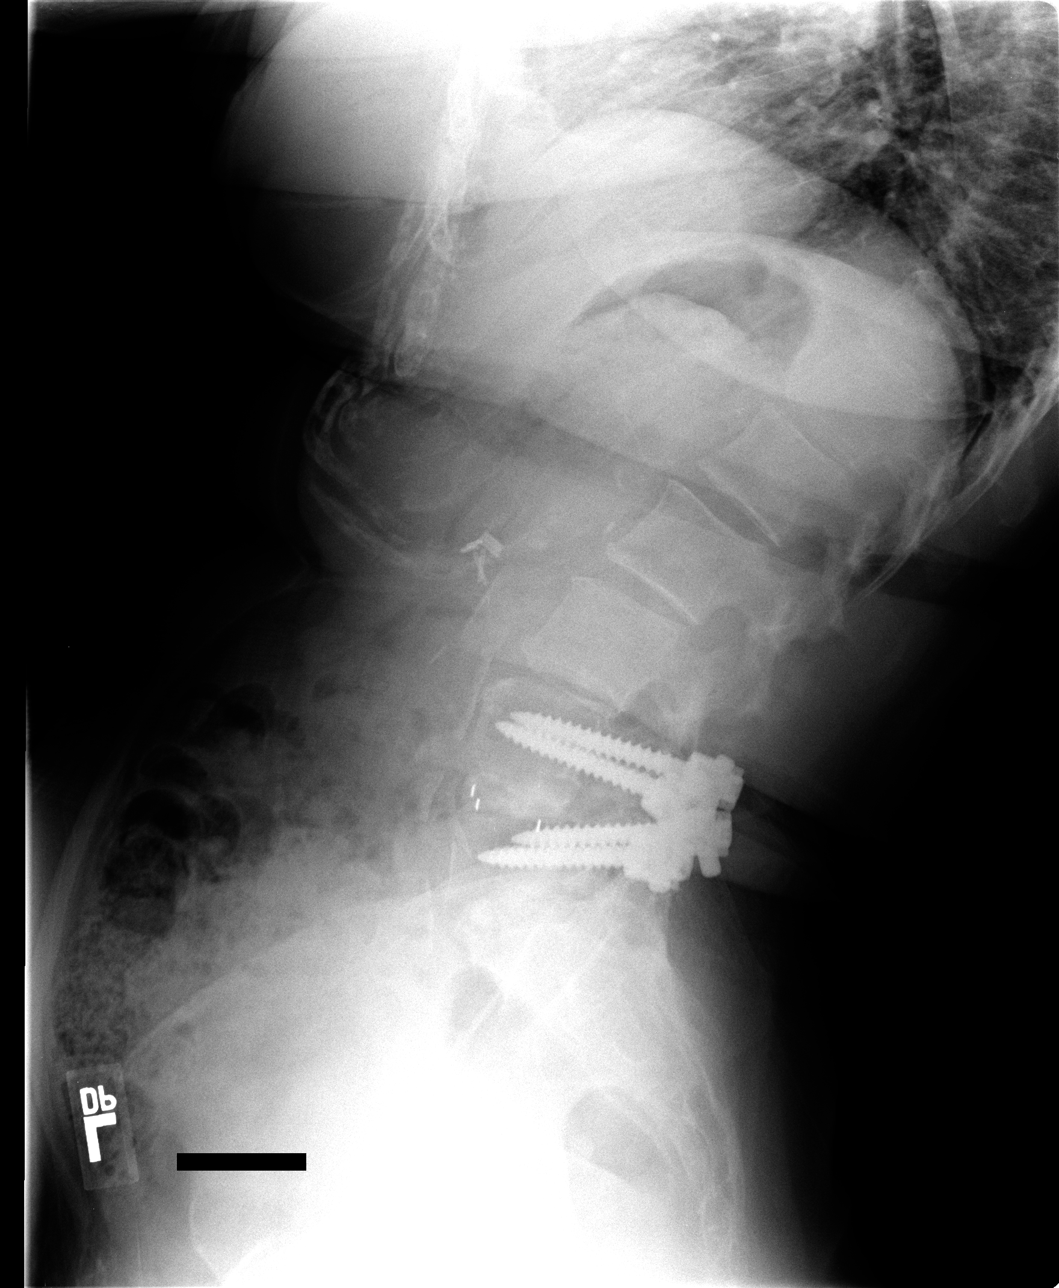

[4 of 4 positions shown; findings below may reference images not displayed]

FINDINGS: AP, lateral neutral, lateral flexion, lateral extension
views.

Five lumbar-type vertebral bodies. Sacroiliac joints are symmetric.

Aortic atherosclerosis.  Trans pedicle screw fixation at L4-L5. No
acute hardware complication.  Degenerative disc disease involves L3-
L4,  L5-S1. Maintenance of vertebral body height and alignment.

Flexion view demonstrates moderately limited range of motion,
without evidence of instability.  Extension view demonstrates good
range of motion, without evidence of instability.
IMPRESSION: Stable appearance of L4-L5 posterior fixation.  No evidence of
instability, given limited range of motion with flexion.

## 2014-10-31 DIAGNOSIS — R0602 Shortness of breath: Secondary | ICD-10-CM | POA: Diagnosis not present

## 2014-10-31 DIAGNOSIS — E780 Pure hypercholesterolemia: Secondary | ICD-10-CM | POA: Diagnosis not present

## 2014-10-31 DIAGNOSIS — I7389 Other specified peripheral vascular diseases: Secondary | ICD-10-CM | POA: Diagnosis not present

## 2014-11-20 DIAGNOSIS — R0602 Shortness of breath: Secondary | ICD-10-CM | POA: Diagnosis not present

## 2014-11-20 DIAGNOSIS — R9431 Abnormal electrocardiogram [ECG] [EKG]: Secondary | ICD-10-CM | POA: Diagnosis not present

## 2014-12-17 DIAGNOSIS — I7389 Other specified peripheral vascular diseases: Secondary | ICD-10-CM | POA: Diagnosis not present

## 2014-12-17 DIAGNOSIS — E780 Pure hypercholesterolemia: Secondary | ICD-10-CM | POA: Diagnosis not present

## 2014-12-17 DIAGNOSIS — R0602 Shortness of breath: Secondary | ICD-10-CM | POA: Diagnosis not present

## 2017-04-04 ENCOUNTER — Other Ambulatory Visit: Payer: Self-pay | Admitting: Internal Medicine

## 2017-04-04 DIAGNOSIS — R634 Abnormal weight loss: Secondary | ICD-10-CM

## 2017-04-11 ENCOUNTER — Ambulatory Visit
Admission: RE | Admit: 2017-04-11 | Discharge: 2017-04-11 | Disposition: A | Discharge status: Home / Self Care | Payer: Medicare Other | Source: Ambulatory Visit | Attending: Internal Medicine | Admitting: Internal Medicine

## 2017-04-11 ENCOUNTER — Ambulatory Visit
Admission: RE | Admit: 2017-04-11 | Discharge: 2017-04-11 | Disposition: A | Discharge status: Home / Self Care | Payer: Self-pay | Source: Ambulatory Visit | Attending: Internal Medicine | Admitting: Internal Medicine

## 2017-04-11 DIAGNOSIS — R634 Abnormal weight loss: Secondary | ICD-10-CM

## 2017-04-11 MED ORDER — IOPAMIDOL (ISOVUE-300) INJECTION 61%
100.0000 mL | Freq: Once | INTRAVENOUS | Status: AC | PRN
  Administered 2017-04-11: 100 mL via INTRAVENOUS

## 2018-11-06 ENCOUNTER — Other Ambulatory Visit: Payer: Self-pay

## 2018-11-06 ENCOUNTER — Encounter: Payer: Self-pay | Admitting: Cardiology

## 2018-11-06 ENCOUNTER — Ambulatory Visit: Payer: Medicare Other | Admitting: Cardiology

## 2018-11-06 VITALS — BP 156/83 | HR 54 | Ht 60.0 in | Wt 110.2 lb

## 2018-11-06 DIAGNOSIS — I739 Peripheral vascular disease, unspecified: Secondary | ICD-10-CM

## 2018-11-06 DIAGNOSIS — E039 Hypothyroidism, unspecified: Secondary | ICD-10-CM

## 2018-11-06 DIAGNOSIS — R0789 Other chest pain: Secondary | ICD-10-CM

## 2018-11-06 DIAGNOSIS — K219 Gastro-esophageal reflux disease without esophagitis: Secondary | ICD-10-CM

## 2018-11-06 DIAGNOSIS — I10 Essential (primary) hypertension: Secondary | ICD-10-CM | POA: Diagnosis not present

## 2018-11-06 DIAGNOSIS — E7800 Pure hypercholesterolemia, unspecified: Secondary | ICD-10-CM

## 2018-11-06 DIAGNOSIS — I7389 Other specified peripheral vascular diseases: Secondary | ICD-10-CM

## 2018-11-06 DIAGNOSIS — R6889 Other general symptoms and signs: Secondary | ICD-10-CM | POA: Diagnosis not present

## 2018-11-06 HISTORY — DX: Hypothyroidism, unspecified: E03.9

## 2018-11-06 HISTORY — DX: Other chest pain: R07.89

## 2018-11-06 HISTORY — DX: Pure hypercholesterolemia, unspecified: E78.00

## 2018-11-06 HISTORY — DX: Other specified peripheral vascular diseases: I73.89

## 2018-11-06 HISTORY — DX: Gastro-esophageal reflux disease without esophagitis: K21.9

## 2018-11-06 MED ORDER — LISINOPRIL 10 MG PO TABS: 10.0000 mg | ORAL_TABLET | Freq: Every day | ORAL | 1 refills | Status: DC

## 2018-11-06 NOTE — Progress Notes (Signed)
Primary Physician:  [name], MD   Patient ID: Kimberly Rosario, female    DOB: May 24, 1945, 73 y.o.   MRN: [id number]  Subjective:    Chief Complaint  Patient presents with  . Borderline ABI  . Mild MR    HPI: Kimberly Rosario  is a 73 y.o. female  with hypothyroidism, GERD, hypercholesterolemia, acrocyanosis, referred back to [place] by PCP for evaluation of abnormal ABI.  Patient was last seen in Jan 2019 for right-sided chest pain. She has not had any recent chest pain. She denies any shortness of breath, PND or orthopnea. She still continues to have loose discoloration of her toes. She has noticed worsening cramping in her legs, particularly at night. Does not necessarily notice this with walking. No ulcerations.  She had echocardiogram in 2016 which essentially revealed mild to moderate LVH and grade 1 diastolic dysfunction with EF of 50% and a normal routine treadmill stress test with low-normal exercise capacity.   Denies any history of hypertension, but states that BP has been elevated recently.   Past Medical History:  Diagnosis Date  . 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 Atypical chest pain 11/06/2018  . GERD (gastroesophageal reflux disease) 11/06/2018  . Hypercholesterolemia 11/06/2018  . Hypothyroidism 11/06/2018    Past Surgical History:  Procedure Laterality Date  . BACK SURGERY      Social History   Socioeconomic History  . Marital status: Widowed    Spouse name: Not on file  . Number of children: 2  . Years of education: Not on file  . Highest education level: Not on file  Occupational History  . Not on file  Social Needs  . Financial resource strain: Not on file  . Food insecurity    Worry: Not on file    Inability: Not on file  . Transportation needs    Medical: Not on file    Non-medical: Not on file  Tobacco Use  . Smoking status: Never Smoker  . Smokeless tobacco: Never Used  Substance and Sexual Activity  . Alcohol use: Never    Frequency:  Never  . Drug use: Never  . Sexual activity: Not on file  Lifestyle  . Physical activity    Days per week: Not on file    Minutes per session: Not on file  . Stress: Not on file  Relationships  . Social Herbalist on phone: Not on file    Gets together: Not on file    Attends religious service: Not on file    Active member of club or organization: Not on file    Attends meetings of clubs or organizations: Not on file    Relationship status: Not on file  . Intimate partner violence    Fear of current or ex partner: Not on file    Emotionally abused: Not on file    Physically abused: Not on file    Forced sexual activity: Not on file  Other Topics Concern  . Not on file  Social History Narrative  . Not on file    Review of Systems  Constitution: Negative for decreased appetite, malaise/fatigue, weight gain and weight loss.  Eyes: Negative for visual disturbance.  Cardiovascular: Negative for chest pain, claudication, dyspnea on exertion, leg swelling, orthopnea, palpitations and syncope.  Respiratory: Negative for hemoptysis and wheezing.   Endocrine: Negative for cold intolerance and heat intolerance.  Hematologic/Lymphatic: Does not bruise/bleed easily.  Skin: Positive for color change (acrocyanosis of toes).  Negative for nail changes.  Musculoskeletal: Negative for muscle weakness and myalgias.  Gastrointestinal: Positive for diarrhea (has IBS). Negative for abdominal pain, change in bowel habit, nausea and vomiting.  Neurological: Positive for paresthesias. Negative for difficulty with concentration, dizziness, focal weakness and headaches.  Psychiatric/Behavioral: Negative for altered mental status and suicidal ideas.  All other systems reviewed and are negative.     Objective:  Blood pressure (!) 156/83, pulse (!) 54, height 5' (1.524 m), weight 110 lb 3.2 oz (50 kg), SpO2 (!) 80 %. Body mass index is 21.52 kg/m.    Physical Exam  Constitutional: She is  oriented to person, place, and time. Vital signs are normal. She appears well-developed and well-nourished.  HENT:  Head: Normocephalic and atraumatic.  Neck: Normal range of motion.  Cardiovascular: Normal rate, regular rhythm and intact distal pulses.  Murmur heard. High-pitched blowing holosystolic murmur is present with a grade of 1/6 at the apex. Pulses:      Femoral pulses are 2+ on the right side and 2+ on the left side.      Popliteal pulses are 2+ on the right side and 2+ on the left side.       Dorsalis pedis pulses are 2+ on the right side and 2+ on the left side.       Posterior tibial pulses are 0 on the right side and 0 on the left side.  Pulmonary/Chest: Effort normal and breath sounds normal. No accessory muscle usage. No respiratory distress.  Abdominal: Soft. Bowel sounds are normal.  Musculoskeletal: Normal range of motion.  Neurological: She is Rosario and oriented to person, place, and time.  Skin: Skin is warm and dry.  Bilateral toes acrocyanotic  Vitals reviewed.  Radiology: No results found.  Laboratory examination:   Labs 03/27/2017: CBC normal, serum glucose 94 mg, BUN 11, creatinine 0.75, CMP normal. Alkaline phosphatase minimally elevated at 131 with normal liver enzymes. Total cholesterol 247, triglycerides 108, HDL 82, LDL 143.  No flowsheet data found. CBC 03/13/2007  WBC 6.0  Hemoglobin 14.3  Hematocrit 40.8  Platelets 399   Lipid Panel  No results found for: CHOL, TRIG, HDL, CHOLHDL, VLDL, LDLCALC, LDLDIRECT HEMOGLOBIN A1C No results found for: HGBA1C, MPG TSH No results for input(s): TSH in the last 8760 hours.  PRN Meds:. There are no discontinued medications. Current Meds  Medication Sig  . ALPRAZolam (XANAX) 0.5 MG tablet as needed.  . cyclobenzaprine (FLEXERIL) 10 MG tablet Take 10 mg by mouth 3 (three) times daily as needed for muscle spasms.  Marland Kitchen levothyroxine (SYNTHROID) 100 MCG tablet daily.  Marland Kitchen omeprazole (PRILOSEC) 20 MG capsule  2 (two) times daily.  . Probiotic Product (PROBIOTIC-10 PO) Take by mouth daily.  . simvastatin (ZOCOR) 20 MG tablet daily.  . Trospium Chloride 60 MG CP24 daily.    Cardiac Studies:   ABI 05/08/2017: This exam reveals mildly decreased perfusion of the lower extremity, noted at the dorsalis pedis artery level (RABI 0.91 and LABI 0.92). Mildly decreased left resting ABI with biphasic waveforms.  Echocardiogram 11/20/2014: Left ventricle cavity is normal in size. Moderate concentric hypertrophy of the left ventricle. Borderline global hypokinesis. Doppler evidence of grade I (impaired) diastolic dysfunction. Visual EF is 50%. Calculated EF 55%. Mild mitral regurgitation. Mild tricuspid regurgitation. No pulmonary hypertension.   Treadmill stress test [11/12/2014]: Indication: Dyspnea, Abnormal ECG The patient exercised according to Bruce Protocol, Total time recorded 5:00 min achieving max heart rate of 145 which was 96 % of MPHR  for age and 7.02 METS of work. Normal BP response. Resting ECG showing NSR. IRBBB, borderline low voltage. There was no ST-T changes of ischemia with exercise stress test. No noted arrythmias. Stress terminated due to THR (>96% MPHR)/MPHR met and dyspnea.  Assessment:     ICD-10-CM   1. Abnormal ankle brachial index (ABI)  R68.89 EKG 12-Lead    PCV ANKLE BRACHIAL INDEX (ABI)  2. Acrocyanosis ([place])  I73.89   3. Essential hypertension  N05 Basic metabolic panel  4. Hypercholesteremia  E78.00 EKG 12-Lead    EKG 11/06/2018: Normal sinus rhythm at the rate of 86 bpm, biatrial enlargement, leftward axis, no evidence of ischemia.  Recommendations:   Patient presents for follow-up for mildly abnormal ABI performed 1 year ago.  Vascular exam is essentially unchanged.  She continues to have acrocyanosis.  No ulcerations are noted.  She does admit to worsening cramping in her legs, mostly at night.  Will repeat ABI for continued surveillance.  She reports simvastatin  was recently increased by her PCP for improved lipid control.  She is noted to be hypertensive and was hypertensive in PCP office.  We will start her on low-dose lisinopril.  Will check BMP in 2 weeks for surveillance of kidney function.  Encouraged her to follow low-sodium diet.  She previously was noted to have mild MR on echocardiogram in 2016.  She is without symptoms of shortness of breath or bilateral leg edema.  Do not suspect any progression of mitral valve.  We will hold off on ordering echocardiogram for now unless clinically there are changes.  She has very soft MR murmur.  I will see her back in 4 weeks to discuss ABI results and follow-up on hypertension.  Miquel Dunn, MSN, APRN, FNP-C [facility]. [place] Office: [phone] Fax: [phone]

## 2018-11-15 ENCOUNTER — Other Ambulatory Visit: Payer: Self-pay | Admitting: Cardiology

## 2018-11-16 LAB — BASIC METABOLIC PANEL
BUN/Creatinine Ratio: 15 (ref 12–28)
BUN: 13 mg/dL (ref 8–27)
CO2: 26 mmol/L (ref 20–29)
Calcium: 9.8 mg/dL (ref 8.7–10.3)
Chloride: 95 mmol/L — ABNORMAL LOW (ref 96–106)
Creatinine, Ser: 0.88 mg/dL (ref 0.57–1.00)
GFR calc Af Amer: 75 mL/min/{1.73_m2} (ref >59)
GFR calc non Af Amer: 65 mL/min/{1.73_m2} (ref >59)
Glucose: 86 mg/dL (ref 65–99)
Potassium: 5.7 mmol/L — ABNORMAL HIGH (ref 3.5–5.2)
Sodium: 136 mmol/L (ref 134–144)

## 2018-11-22 ENCOUNTER — Ambulatory Visit (INDEPENDENT_AMBULATORY_CARE_PROVIDER_SITE_OTHER): Payer: Medicare Other

## 2018-11-22 ENCOUNTER — Other Ambulatory Visit: Payer: Self-pay

## 2018-11-22 DIAGNOSIS — I739 Peripheral vascular disease, unspecified: Secondary | ICD-10-CM | POA: Diagnosis not present

## 2018-11-22 DIAGNOSIS — R6889 Other general symptoms and signs: Secondary | ICD-10-CM

## 2018-12-04 ENCOUNTER — Ambulatory Visit: Payer: Medicare Other | Admitting: Cardiology

## 2018-12-04 ENCOUNTER — Other Ambulatory Visit: Payer: Self-pay

## 2018-12-04 ENCOUNTER — Encounter: Payer: Self-pay | Admitting: Cardiology

## 2018-12-04 ENCOUNTER — Telehealth: Payer: Self-pay

## 2018-12-04 VITALS — BP 116/64 | HR 89 | Ht 60.0 in | Wt 109.5 lb

## 2018-12-04 DIAGNOSIS — R6889 Other general symptoms and signs: Secondary | ICD-10-CM

## 2018-12-04 DIAGNOSIS — I10 Essential (primary) hypertension: Secondary | ICD-10-CM | POA: Diagnosis not present

## 2018-12-04 DIAGNOSIS — I7389 Other specified peripheral vascular diseases: Secondary | ICD-10-CM

## 2018-12-04 DIAGNOSIS — T464X5A Adverse effect of angiotensin-converting-enzyme inhibitors, initial encounter: Secondary | ICD-10-CM

## 2018-12-04 DIAGNOSIS — R05 Cough: Secondary | ICD-10-CM | POA: Diagnosis not present

## 2018-12-04 MED ORDER — AMLODIPINE BESYLATE 2.5 MG PO TABS: 2.5000 mg | ORAL_TABLET | Freq: Every day | ORAL | 1 refills | Status: DC

## 2018-12-04 NOTE — Telephone Encounter (Signed)
LMOM with details

## 2018-12-04 NOTE — Telephone Encounter (Signed)
-----   Message from Miquel Dunn, NP sent at 11/23/2018  8:50 AM EDT ----- Regarding: BMP Let patient know that her potassium level came back a little elevated at 5.7. Have her cut her lisinopril in half and watch potassium in her diet.  Thanks,  AK

## 2018-12-04 NOTE — Progress Notes (Signed)
Primary Physician:  [name], MD   Patient ID: Kimberly Rosario, female    DOB: 01-18-1946, 73 y.o.   MRN: [id number]  Subjective:    Chief Complaint  Patient presents with  . Hypertension    lab/abi  results    HPI: Kimberly Rosario  is a 73 y.o. female  with hypothyroidism, GERD, hypercholesterolemia, acrocyanosis, recently reevaluated by [place] for evaluation of abnormal ABI.  She denies any chest pain, shortness of breath, PND or orthopnea. She still continues to have bluish discoloration of her toes. She has noticed worsening cramping in her legs, particularly at night. Also has some tingling, numbness sensation in her feet bilaterally. Does not necessarily notice this with walking. No ulcerations.  She had echocardiogram in 2016 which essentially revealed mild to moderate LVH and grade 1 diastolic dysfunction with EF of 50% and a normal routine treadmill stress test with low-normal exercise capacity.   Denies any history of hypertension, but states that BP has been elevated recently.   Past Medical History:  Diagnosis Date  . Acrocyanosis ([place]) 11/06/2018  . Atypical chest pain 11/06/2018  . GERD (gastroesophageal reflux disease) 11/06/2018  . Hypercholesterolemia 11/06/2018  . Hypothyroidism 11/06/2018    Past Surgical History:  Procedure Laterality Date  . BACK SURGERY      Social History   Socioeconomic History  . Marital status: Widowed    Spouse name: Not on file  . Number of children: 2  . Years of education: Not on file  . Highest education level: Not on file  Occupational History  . Not on file  Social Needs  . Financial resource strain: Not on file  . Food insecurity    Worry: Not on file    Inability: Not on file  . Transportation needs    Medical: Not on file    Non-medical: Not on file  Tobacco Use  . Smoking status: Never Smoker  . Smokeless tobacco: Never Used  Substance and Sexual Activity  . Alcohol use: Never    Frequency: Never  . Drug  use: Never  . Sexual activity: Not on file  Lifestyle  . Physical activity    Days per week: Not on file    Minutes per session: Not on file  . Stress: Not on file  Relationships  . Social Herbalist on phone: Not on file    Gets together: Not on file    Attends religious service: Not on file    Active member of club or organization: Not on file    Attends meetings of clubs or organizations: Not on file    Relationship status: Not on file  . Intimate partner violence    Fear of current or ex partner: Not on file    Emotionally abused: Not on file    Physically abused: Not on file    Forced sexual activity: Not on file  Other Topics Concern  . Not on file  Social History Narrative  . Not on file    Review of Systems  Constitution: Negative for decreased appetite, malaise/fatigue, weight gain and weight loss.  Eyes: Negative for visual disturbance.  Cardiovascular: Negative for chest pain, claudication, dyspnea on exertion, leg swelling, orthopnea, palpitations and syncope.  Respiratory: Negative for hemoptysis and wheezing.   Endocrine: Negative for cold intolerance and heat intolerance.  Hematologic/Lymphatic: Does not bruise/bleed easily.  Skin: Positive for color change (acrocyanosis of toes). Negative for nail changes.  Musculoskeletal: Negative for  muscle weakness and myalgias.  Gastrointestinal: Positive for diarrhea (has IBS). Negative for abdominal pain, change in bowel habit, nausea and vomiting.  Neurological: Positive for paresthesias. Negative for difficulty with concentration, dizziness, focal weakness and headaches.  Psychiatric/Behavioral: Negative for altered mental status and suicidal ideas.  All other systems reviewed and are negative.     Objective:  Blood pressure 116/64, pulse 89, height 5' (1.524 m), weight 109 lb 8 oz (49.7 kg), SpO2 97 %. Body mass index is 21.39 kg/m.    Physical Exam  Constitutional: She is oriented to person, place,  and time. Vital signs are normal. She appears well-developed and well-nourished.  HENT:  Head: Normocephalic and atraumatic.  Neck: Normal range of motion.  Cardiovascular: Normal rate, regular rhythm and intact distal pulses.  Murmur heard. High-pitched blowing holosystolic murmur is present with a grade of 1/6 at the apex. Pulses:      Femoral pulses are 2+ on the right side and 2+ on the left side.      Popliteal pulses are 2+ on the right side and 2+ on the left side.       Dorsalis pedis pulses are 2+ on the right side and 2+ on the left side.       Posterior tibial pulses are 0 on the right side and 0 on the left side.  Pulmonary/Chest: Effort normal and breath sounds normal. No accessory muscle usage. No respiratory distress.  Abdominal: Soft. Bowel sounds are normal.  Musculoskeletal: Normal range of motion.  Neurological: She is Rosario and oriented to person, place, and time.  Skin: Skin is warm and dry.  Bilateral toes acrocyanotic  Vitals reviewed.  Radiology: No results found.  Laboratory examination:   Labs 03/27/2017: CBC normal, serum glucose 94 mg, BUN 11, creatinine 0.75, CMP normal. Alkaline phosphatase minimally elevated at 131 with normal liver enzymes. Total cholesterol 247, triglycerides 108, HDL 82, LDL 143.  CMP Latest Ref Rng & Units 11/15/2018  Glucose 65 - 99 mg/dL 86  BUN 8 - 27 mg/dL 13  Creatinine 0.57 - 1.00 mg/dL 0.88  Sodium 134 - 144 mmol/L 136  Potassium 3.5 - 5.2 mmol/L 5.7(H)  Chloride 96 - 106 mmol/L 95(L)  CO2 20 - 29 mmol/L 26  Calcium 8.7 - 10.3 mg/dL 9.8   CBC 03/13/2007  WBC 6.0  Hemoglobin 14.3  Hematocrit 40.8  Platelets 399   Lipid Panel  No results found for: CHOL, TRIG, HDL, CHOLHDL, VLDL, LDLCALC, LDLDIRECT HEMOGLOBIN A1C No results found for: HGBA1C, MPG TSH No results for input(s): TSH in the last 8760 hours.  PRN Meds:. Medications Discontinued During This Encounter  Medication Reason  . lisinopril (ZESTRIL) 10 MG  tablet Discontinued by provider   Current Meds  Medication Sig  . ALPRAZolam (XANAX) 0.5 MG tablet as needed.  . cyclobenzaprine (FLEXERIL) 10 MG tablet Take 10 mg by mouth 3 (three) times daily as needed for muscle spasms.  Marland Kitchen levothyroxine (SYNTHROID) 100 MCG tablet daily.  Marland Kitchen omeprazole (PRILOSEC) 20 MG capsule 2 (two) times daily.  . Probiotic Product (PROBIOTIC-10 PO) Take by mouth daily.  . simvastatin (ZOCOR) 20 MG tablet daily.  . Trospium Chloride 60 MG CP24 daily.  . [DISCONTINUED] lisinopril (ZESTRIL) 10 MG tablet Take 1 tablet (10 mg total) by mouth daily.    Cardiac Studies:   ABI 11/22/2018: This exam reveals mildly decreased perfusion of the lower extremity, noted at the dorsalis pedis and post tibial artery level (RABI 0.88, LABI 0.88).  No  significant change since 05/08/2017.  Echocardiogram 11/20/2014: Left ventricle cavity is normal in size. Moderate concentric hypertrophy of the left ventricle. Borderline global hypokinesis. Doppler evidence of grade I (impaired) diastolic dysfunction. Visual EF is 50%. Calculated EF 55%. Mild mitral regurgitation. Mild tricuspid regurgitation. No pulmonary hypertension.   Treadmill stress test [11/12/2014]: Indication: Dyspnea, Abnormal ECG The patient exercised according to Bruce Protocol, Total time recorded 5:00 min achieving max heart rate of 145 which was 96 % of MPHR for age and 7.02 METS of work. Normal BP response. Resting ECG showing NSR. IRBBB, borderline low voltage. There was no ST-T changes of ischemia with exercise stress test. No noted arrythmias. Stress terminated due to THR (>96% MPHR)/MPHR met and dyspnea.  Assessment:     ICD-10-CM   1. Abnormal ankle brachial index (ABI)  R68.89 PCV LOWER ARTERIAL (BILATERAL)  2. Acrocyanosis ([place])  I73.89 PCV LOWER ARTERIAL (BILATERAL)  3. Essential hypertension  I10     EKG 11/06/2018: Normal sinus rhythm at the rate of 86 bpm, biatrial enlargement, leftward axis, no  evidence of ischemia.  Recommendations:   I have discussed recently obtained ABI results with the patient, had very minimal progression of ABI to 0.8 bilaterally.  Given her acrocyanosis and mildly abnormal ABI, will obtain lower extremity arterial duplex for further evaluation.  She does have cramping at night with lying in bed, but no symptoms of claudication.  Blood pressure improved significantly with lisinopril; however, she did develop some mild hyperkalemia and also dry hacking cough.  Will discontinue lisinopril and start low-dose amlodipine.  Continue with following low-sodium diet to help with controlling her blood pressure.  I will see her back after her lower extremity arterial duplex in approximately 6 weeks and also follow-up on hypertension.  Miquel Dunn, MSN, APRN, FNP-C [facility]. [place] Office: [phone] Fax: [phone]

## 2019-01-04 ENCOUNTER — Other Ambulatory Visit: Payer: Self-pay | Admitting: Cardiology

## 2019-01-07 ENCOUNTER — Ambulatory Visit (INDEPENDENT_AMBULATORY_CARE_PROVIDER_SITE_OTHER): Payer: Medicare Other

## 2019-01-07 ENCOUNTER — Other Ambulatory Visit: Payer: Self-pay

## 2019-01-07 DIAGNOSIS — R9439 Abnormal result of other cardiovascular function study: Secondary | ICD-10-CM | POA: Diagnosis not present

## 2019-01-07 DIAGNOSIS — I7389 Other specified peripheral vascular diseases: Secondary | ICD-10-CM | POA: Diagnosis not present

## 2019-01-07 DIAGNOSIS — R6889 Other general symptoms and signs: Secondary | ICD-10-CM

## 2019-01-15 ENCOUNTER — Encounter: Payer: Self-pay | Admitting: Cardiology

## 2019-01-15 ENCOUNTER — Other Ambulatory Visit: Payer: Self-pay

## 2019-01-15 ENCOUNTER — Ambulatory Visit: Payer: Medicare Other | Admitting: Cardiology

## 2019-01-15 VITALS — BP 124/71 | HR 92 | Temp 98.6°F | Ht 60.0 in | Wt 106.2 lb

## 2019-01-15 DIAGNOSIS — I10 Essential (primary) hypertension: Secondary | ICD-10-CM

## 2019-01-15 DIAGNOSIS — I739 Peripheral vascular disease, unspecified: Secondary | ICD-10-CM

## 2019-01-15 DIAGNOSIS — G629 Polyneuropathy, unspecified: Secondary | ICD-10-CM

## 2019-01-15 MED ORDER — AMLODIPINE BESYLATE 2.5 MG PO TABS: 2.5000 mg | ORAL_TABLET | Freq: Every day | ORAL | 8 refills | Status: DC

## 2019-01-15 NOTE — Progress Notes (Signed)
Primary Physician:  [name], MD   Patient ID: Kimberly Kimberly Rosario, female    DOB: 11/25/1945, 73 y.o.   MRN: [id number]  Subjective:    Chief Complaint  Patient presents with   Abnormal ABI    6 week f/u   Hypertension   Follow-up    HPI: Kimberly Kimberly Rosario  is a 73 y.o. female  with hypothyroidism, GERD, hypercholesterolemia, acrocyanosis, due to abnormal ABI, underwent LE arterial duplex and now presents for follow up.   She denies any chest pain, shortness of breath, PND or orthopnea. She still continues to have bluish discoloration of her toes.  Her biggest complaint is tingling/numb sensation in her feet particularly at night along with pain.  She does not have any pain with walking, no leg fatigue or cramping. No ulcerations.  No history of diabetes.  She has had 3 back surgeries in the past.  States the tingling/numbness sensation has been present for the last 1 to 2 years.  Blood pressure has improved with addition of amlodipine.  Cough has also resolved since being off of lisinopril.  She had echocardiogram in 2016 which essentially revealed mild to moderate LVH and grade 1 diastolic dysfunction with EF of 50% and a normal routine treadmill stress test with low-normal exercise capacity.   Denies any history of hypertension, but states that BP has been elevated recently.   Past Medical History:  Diagnosis Date   Acrocyanosis ([place]) 11/06/2018   Atypical chest pain 11/06/2018   GERD (gastroesophageal reflux disease) 11/06/2018   Hypercholesterolemia 11/06/2018   Hypothyroidism 11/06/2018    Past Surgical History:  Procedure Laterality Date   BACK SURGERY      Social History   Socioeconomic History   Marital status: Widowed    Spouse name: Not on file   Number of children: 2   Years of education: Not on file   Highest education level: Not on file  Occupational History   Not on file  Social Needs   Financial resource strain: Not on file   Food  insecurity    Worry: Not on file    Inability: Not on file   Transportation needs    Medical: Not on file    Non-medical: Not on file  Tobacco Use   Smoking status: Never Smoker   Smokeless tobacco: Never Used  Substance and Sexual Activity   Alcohol use: Never    Frequency: Never   Drug use: Never   Sexual activity: Not on file  Lifestyle   Physical activity    Days per week: Not on file    Minutes per session: Not on file   Stress: Not on file  Relationships   Social connections    Talks on phone: Not on file    Gets together: Not on file    Attends religious service: Not on file    Active member of club or organization: Not on file    Attends meetings of clubs or organizations: Not on file    Relationship status: Not on file   Intimate partner violence    Fear of current or ex partner: Not on file    Emotionally abused: Not on file    Physically abused: Not on file    Forced sexual activity: Not on file  Other Topics Concern   Not on file  Social History Narrative   Not on file    Review of Systems  Constitution: Negative for decreased appetite, malaise/fatigue, weight gain and  weight loss.  Eyes: Negative for visual disturbance.  Cardiovascular: Negative for chest pain, claudication, dyspnea on exertion, leg swelling, orthopnea, palpitations and syncope.  Respiratory: Negative for hemoptysis and wheezing.   Endocrine: Negative for cold intolerance and heat intolerance.  Hematologic/Lymphatic: Does not bruise/bleed easily.  Skin: Positive for color change (acrocyanosis of toes). Negative for nail changes.  Musculoskeletal: Negative for muscle weakness and myalgias.  Gastrointestinal: Positive for diarrhea (has IBS). Negative for abdominal pain, change in bowel habit, nausea and vomiting.  Neurological: Positive for paresthesias. Negative for difficulty with concentration, dizziness, focal weakness and headaches.  Psychiatric/Behavioral: Negative for  altered mental status and suicidal ideas.  All other systems reviewed and are negative.     Objective:  Blood pressure 124/71, pulse 92, temperature 98.6 F (37 C), height 5' (1.524 m), weight 106 lb 3.2 oz (48.2 kg), SpO2 99 %. Body mass index is 20.74 kg/m.    Physical Exam  Constitutional: She is oriented to person, place, and time. Vital signs are normal. She appears well-developed and well-nourished.  HENT:  Head: Normocephalic and atraumatic.  Neck: Normal range of motion.  Cardiovascular: Normal rate, regular rhythm and intact distal pulses.  Murmur heard. High-pitched blowing holosystolic murmur is present with a grade of 1/6 at the apex. Pulses:      Femoral pulses are 2+ on the right side and 2+ on the left side.      Popliteal pulses are 2+ on the right side and 2+ on the left side.       Dorsalis pedis pulses are 2+ on the right side and 2+ on the left side.       Posterior tibial pulses are 0 on the right side and 0 on the left side.  Pulmonary/Chest: Effort normal and breath sounds normal. No accessory muscle usage. No respiratory distress.  Abdominal: Soft. Bowel sounds are normal.  Musculoskeletal: Normal range of motion.  Neurological: She is Kimberly Rosario and oriented to person, place, and time.  Skin: Skin is warm and dry.  Bilateral toes acrocyanotic  Vitals reviewed.  Radiology: No results found.  Laboratory examination:   Labs 03/27/2017: CBC normal, serum glucose 94 mg, BUN 11, creatinine 0.75, CMP normal. Alkaline phosphatase minimally elevated at 131 with normal liver enzymes. Total cholesterol 247, triglycerides 108, HDL 82, LDL 143.  CMP Latest Ref Rng & Units 11/15/2018  Glucose 65 - 99 mg/dL 86  BUN 8 - 27 mg/dL 13  Creatinine 0.57 - 1.00 mg/dL 0.88  Sodium 134 - 144 mmol/L 136  Potassium 3.5 - 5.2 mmol/L 5.7(H)  Chloride 96 - 106 mmol/L 95(L)  CO2 20 - 29 mmol/L 26  Calcium 8.7 - 10.3 mg/dL 9.8   CBC 03/13/2007  WBC 6.0  Hemoglobin 14.3    Hematocrit 40.8  Platelets 399   Lipid Panel  No results found for: CHOL, TRIG, HDL, CHOLHDL, VLDL, LDLCALC, LDLDIRECT HEMOGLOBIN A1C No results found for: HGBA1C, MPG TSH No results for input(s): TSH in the last 8760 hours.  PRN Meds:. Medications Discontinued During This Encounter  Medication Reason   amLODipine (NORVASC) 2.5 MG tablet Reorder   Current Meds  Medication Sig   ALPRAZolam (XANAX) 0.5 MG tablet as needed.   amLODipine (NORVASC) 2.5 MG tablet Take 1 tablet (2.5 mg total) by mouth daily.   cyclobenzaprine (FLEXERIL) 10 MG tablet Take 10 mg by mouth 3 (three) times daily as needed for muscle spasms.   levothyroxine (SYNTHROID) 100 MCG tablet Take 100 mcg by mouth daily.  omeprazole (PRILOSEC) 20 MG capsule 2 (two) times daily.   Probiotic Product (PROBIOTIC-10 PO) Take by mouth daily.   simvastatin (ZOCOR) 20 MG tablet Take 20 mg by mouth daily at 6 PM.    Trospium Chloride 60 MG CP24 daily.   [DISCONTINUED] amLODipine (NORVASC) 2.5 MG tablet Take 1 tablet (2.5 mg total) by mouth daily.    Cardiac Studies:   Lower Extremity Arterial Duplex 01/07/2019: No hemodynamically significant stenoses are identified in the bilateral lower extremity arterial system.  This exam reveals mildly decreased perfusion of the bilateral lower extremity, noted at the post tibial artery level (ABI 0.89).  Study may suggest mild diffuse PAD.  ABI 11/22/2018: This exam reveals mildly decreased perfusion of the lower extremity, noted at the dorsalis pedis and post tibial artery level (RABI 0.88, LABI 0.88).  No significant change since 05/08/2017.  Echocardiogram 11/20/2014: Left ventricle cavity is normal in size. Moderate concentric hypertrophy of the left ventricle. Borderline global hypokinesis. Doppler evidence of grade I (impaired) diastolic dysfunction. Visual EF is 50%. Calculated EF 55%. Mild mitral regurgitation. Mild tricuspid regurgitation. No pulmonary  hypertension.  Treadmill stress test [11/12/2014]: Indication: Dyspnea, Abnormal ECG The patient exercised according to Bruce Protocol, Total time recorded 5:00 min achieving max heart rate of 145 which was 96 % of MPHR for age and 7.02 METS of work. Normal BP response. Resting ECG showing NSR. IRBBB, borderline low voltage. There was no ST-T changes of ischemia with exercise stress test. No noted arrythmias. Stress terminated due to THR (>96% MPHR)/MPHR met and dyspnea.  Assessment:     ICD-10-CM   1. PAD (peripheral artery disease) (HCC)  I73.9   2. Essential hypertension  I10   3. Neuropathy  G62.9     EKG 11/06/2018: Normal sinus rhythm at the rate of 86 bpm, biatrial enlargement, leftward axis, no evidence of ischemia.  Recommendations:   I discussed recently obtain lower extremity arterial duplex results with the patient, she was not noted to have any hemodynamically significant stenosis, only mildly reduced ABI.  Study suggested mild diffuse PAD.  She is without symptoms of claudication and has essentially normal vascular exam.  I recommended continued conservative measures.  Encouraged her to start daily walking and continue with statin therapy.  We will continue to closely monitor her symptoms.  I feel that her tingling and numbness sensation is likely related to neuropathy from previous back surgeries.  She has an upcoming appointment with her PCP next week, encouraged her to discuss with him regarding gabapentin to help with this.  Blood pressure has improved with addition of amlodipine.  We will continue with this.  I will plan to see her back in approximately 8 months for follow-up, but encouraged her to contact me sooner if needed.  Miquel Dunn, MSN, APRN, FNP-C [facility]. [place] Office: [phone] Fax: [phone]

## 2019-05-02 ENCOUNTER — Other Ambulatory Visit (HOSPITAL_COMMUNITY): Payer: Self-pay | Admitting: Internal Medicine

## 2019-05-02 ENCOUNTER — Other Ambulatory Visit: Payer: Self-pay | Admitting: Internal Medicine

## 2019-05-02 DIAGNOSIS — K219 Gastro-esophageal reflux disease without esophagitis: Secondary | ICD-10-CM

## 2019-05-10 ENCOUNTER — Ambulatory Visit (HOSPITAL_COMMUNITY): Payer: Medicare Other

## 2019-05-16 ENCOUNTER — Other Ambulatory Visit: Payer: Self-pay

## 2019-05-16 ENCOUNTER — Ambulatory Visit (HOSPITAL_COMMUNITY)
Admission: RE | Admit: 2019-05-16 | Discharge: 2019-05-16 | Disposition: A | Discharge status: Home / Self Care | Payer: Medicare PPO | Source: Ambulatory Visit | Attending: Internal Medicine | Admitting: Internal Medicine

## 2019-05-16 DIAGNOSIS — K219 Gastro-esophageal reflux disease without esophagitis: Secondary | ICD-10-CM | POA: Insufficient documentation

## 2019-09-17 ENCOUNTER — Ambulatory Visit: Payer: Medicare Other | Admitting: Cardiology

## 2019-09-24 DIAGNOSIS — I739 Peripheral vascular disease, unspecified: Secondary | ICD-10-CM | POA: Insufficient documentation

## 2019-09-24 DIAGNOSIS — I10 Essential (primary) hypertension: Secondary | ICD-10-CM | POA: Insufficient documentation

## 2019-09-24 DIAGNOSIS — E782 Mixed hyperlipidemia: Secondary | ICD-10-CM | POA: Insufficient documentation

## 2019-09-24 NOTE — Progress Notes (Signed)
Follow up visit  Subjective:   Kimberly Rosario, female    DOB: 1945/05/06, 74 y.o.   MRN: [id number]    HPI   Chief Complaint  Patient presents with  . PAD  . Follow-up    70 month    74 year old female with hypertension. Hyperlipidemia,  mild PAD, hypothyroidism, GERD  Patient has recently noticed painful blue and white discoloration of her fingers with exposure to cold temperatures.  This improves with warmth.  Otherwise, she denies chest pain, shortness of breath, palpitations, leg edema, orthopnea, PND, TIA/syncope.   Current Outpatient Medications on File Prior to Visit  Medication Sig Dispense Refill  . ALPRAZolam (XANAX) 0.5 MG tablet as needed.    Marland Kitchen amLODipine (NORVASC) 2.5 MG tablet Take 1 tablet (2.5 mg total) by mouth daily. 30 tablet 8  . cyclobenzaprine (FLEXERIL) 10 MG tablet Take 10 mg by mouth 3 (three) times daily as needed for muscle spasms.    Marland Kitchen levothyroxine (SYNTHROID) 100 MCG tablet Take 100 mcg by mouth daily.     . pantoprazole (PROTONIX) 40 MG tablet Take 40 mg by mouth 2 (two) times daily.    . simvastatin (ZOCOR) 40 MG tablet Take 40 mg by mouth daily.    . Trospium Chloride 60 MG CP24 daily.    Marland Kitchen VITAMIN D, CHOLECALCIFEROL, PO Take 5,000 Units by mouth daily.     No current facility-administered medications on file prior to visit.    Cardiovascular & other pertient studies:  EKG 09/25/2019: Sinus rhythm 82 bpm Early R wave transition, otherwise normal EKG  Lower Extremity Arterial Duplex 01/07/2019:  No hemodynamically significant stenoses are identified in the bilateral  lower extremity arterial system.  This exam reveals mildly decreased perfusion of the bilateral lower  extremity, noted at the post tibial artery level (ABI 0.89). Study may  suggest mild diffuse PAD.  ABI 11/22/2018:  This exam reveals mildly decreased perfusion of the lower extremity, noted  at the dorsalis pedis and post tibial artery level (RABI 0.88, LABI 0.88).     No significant change since 05/08/2017.  Recent labs: 04/25/2019: EGFR 57 Chol 208, TG 94, HDL 66, LDL 113 TSH 0.4 normal  11/15/2018: Glucose 86, BUN/Cr 13/0.88. EGFR 65. Na/K 136/5.7.  H/H 14/40. MCV 87. Platelets 399   Review of Systems  Cardiovascular: Negative for chest pain, dyspnea on exertion, leg swelling, palpitations and syncope.         Vitals:   09/25/19 0931  BP: 136/75  Pulse: 83  SpO2: 97%     Body mass index is 20.05 kg/m. Filed Weights   09/25/19 0931  Weight: 100 lb 9.6 oz (45.6 kg)     Objective:   Physical Exam Vitals and nursing note reviewed.  Constitutional:      General: She is not in acute distress. Neck:     Vascular: No JVD.  Cardiovascular:     Rate and Rhythm: Normal rate and regular rhythm.     Pulses: Normal pulses.     Heart sounds: Normal heart sounds. No murmur heard.   Pulmonary:     Effort: Pulmonary effort is normal.     Breath sounds: Normal breath sounds. No wheezing or rales.           Assessment & Recommendations:   74 year old female with hypertension. Hyperlipidemia,  mild PAD, hypothyroidism, GERD, Raynaud's phenomenon  Raynaud's phenomenon: Avoid exposure to cold.  Increase amlodipine to 5 mg daily.  Hypertension: Increase amlodipine to 5 mg  daily, expect blood pressure control to improve further.  Hyperlipidemia: 10-year ASCVD risk 21%.  Especially in light of increased amlodipine dose, switch simvastatin 40 mg daily to rosuvastatin 20 mg daily.  Repeat lipid panel in 6 months.  PAD: Mild.  No clinical symptoms.  Continue medical management and regular exercise.  Follow-up in 6 months   Gudelia Eugene Esther Hardy, MD [facility]. [place] Pager: [phone] Office: [phone]

## 2019-09-25 ENCOUNTER — Ambulatory Visit: Payer: Medicare PPO | Admitting: Cardiology

## 2019-09-25 ENCOUNTER — Other Ambulatory Visit: Payer: Self-pay

## 2019-09-25 ENCOUNTER — Encounter: Payer: Self-pay | Admitting: Cardiology

## 2019-09-25 VITALS — BP 136/75 | HR 83 | Ht 59.4 in | Wt 100.6 lb

## 2019-09-25 DIAGNOSIS — I739 Peripheral vascular disease, unspecified: Secondary | ICD-10-CM | POA: Diagnosis not present

## 2019-09-25 DIAGNOSIS — I10 Essential (primary) hypertension: Secondary | ICD-10-CM

## 2019-09-25 DIAGNOSIS — I7300 Raynaud's syndrome without gangrene: Secondary | ICD-10-CM | POA: Diagnosis not present

## 2019-09-25 DIAGNOSIS — E782 Mixed hyperlipidemia: Secondary | ICD-10-CM | POA: Diagnosis not present

## 2019-09-25 MED ORDER — AMLODIPINE BESYLATE 5 MG PO TABS: 5.0000 mg | ORAL_TABLET | Freq: Every day | ORAL | 3 refills | Status: AC

## 2019-09-25 MED ORDER — ROSUVASTATIN CALCIUM 20 MG PO TABS: 20.0000 mg | ORAL_TABLET | Freq: Every day | ORAL | 3 refills | Status: AC

## 2019-09-27 DIAGNOSIS — D3132 Benign neoplasm of left choroid: Secondary | ICD-10-CM | POA: Diagnosis not present

## 2019-09-27 DIAGNOSIS — H26492 Other secondary cataract, left eye: Secondary | ICD-10-CM | POA: Diagnosis not present

## 2019-10-29 DIAGNOSIS — E039 Hypothyroidism, unspecified: Secondary | ICD-10-CM | POA: Diagnosis not present

## 2019-10-29 DIAGNOSIS — Z0000 Encounter for general adult medical examination without abnormal findings: Secondary | ICD-10-CM | POA: Diagnosis not present

## 2019-10-29 DIAGNOSIS — E559 Vitamin D deficiency, unspecified: Secondary | ICD-10-CM | POA: Diagnosis not present

## 2019-10-29 DIAGNOSIS — J449 Chronic obstructive pulmonary disease, unspecified: Secondary | ICD-10-CM | POA: Diagnosis not present

## 2019-10-29 DIAGNOSIS — M8580 Other specified disorders of bone density and structure, unspecified site: Secondary | ICD-10-CM | POA: Diagnosis not present

## 2019-10-29 DIAGNOSIS — I10 Essential (primary) hypertension: Secondary | ICD-10-CM | POA: Diagnosis not present

## 2019-10-29 DIAGNOSIS — G629 Polyneuropathy, unspecified: Secondary | ICD-10-CM | POA: Diagnosis not present

## 2019-11-01 DIAGNOSIS — H18413 Arcus senilis, bilateral: Secondary | ICD-10-CM | POA: Diagnosis not present

## 2019-11-01 DIAGNOSIS — Z961 Presence of intraocular lens: Secondary | ICD-10-CM | POA: Diagnosis not present

## 2019-11-01 DIAGNOSIS — H02831 Dermatochalasis of right upper eyelid: Secondary | ICD-10-CM | POA: Diagnosis not present

## 2019-11-01 DIAGNOSIS — H26492 Other secondary cataract, left eye: Secondary | ICD-10-CM | POA: Diagnosis not present

## 2019-11-05 ENCOUNTER — Other Ambulatory Visit: Payer: Self-pay | Admitting: Internal Medicine

## 2019-11-05 DIAGNOSIS — E559 Vitamin D deficiency, unspecified: Secondary | ICD-10-CM | POA: Diagnosis not present

## 2019-11-05 DIAGNOSIS — R911 Solitary pulmonary nodule: Secondary | ICD-10-CM | POA: Diagnosis not present

## 2019-11-05 DIAGNOSIS — K219 Gastro-esophageal reflux disease without esophagitis: Secondary | ICD-10-CM | POA: Diagnosis not present

## 2019-11-05 DIAGNOSIS — F419 Anxiety disorder, unspecified: Secondary | ICD-10-CM | POA: Diagnosis not present

## 2019-11-05 DIAGNOSIS — E039 Hypothyroidism, unspecified: Secondary | ICD-10-CM | POA: Diagnosis not present

## 2019-11-05 DIAGNOSIS — E7800 Pure hypercholesterolemia, unspecified: Secondary | ICD-10-CM | POA: Diagnosis not present

## 2019-11-05 DIAGNOSIS — I10 Essential (primary) hypertension: Secondary | ICD-10-CM | POA: Diagnosis not present

## 2019-11-18 ENCOUNTER — Other Ambulatory Visit: Payer: Medicare PPO

## 2019-11-20 ENCOUNTER — Ambulatory Visit
Admission: RE | Admit: 2019-11-20 | Discharge: 2019-11-20 | Disposition: A | Discharge status: Home / Self Care | Payer: Medicare PPO | Source: Ambulatory Visit | Attending: Internal Medicine | Admitting: Internal Medicine

## 2019-11-20 ENCOUNTER — Other Ambulatory Visit: Payer: Self-pay

## 2019-11-20 DIAGNOSIS — J432 Centrilobular emphysema: Secondary | ICD-10-CM | POA: Diagnosis not present

## 2019-11-20 DIAGNOSIS — I2510 Atherosclerotic heart disease of native coronary artery without angina pectoris: Secondary | ICD-10-CM | POA: Diagnosis not present

## 2019-11-20 DIAGNOSIS — I700 Atherosclerosis of aorta: Secondary | ICD-10-CM | POA: Diagnosis not present

## 2019-11-20 DIAGNOSIS — R911 Solitary pulmonary nodule: Secondary | ICD-10-CM

## 2019-11-20 DIAGNOSIS — M19011 Primary osteoarthritis, right shoulder: Secondary | ICD-10-CM | POA: Diagnosis not present

## 2020-03-27 ENCOUNTER — Ambulatory Visit: Payer: Medicare PPO | Admitting: Cardiology

## 2020-05-11 DIAGNOSIS — I10 Essential (primary) hypertension: Secondary | ICD-10-CM | POA: Diagnosis not present

## 2020-05-11 DIAGNOSIS — E785 Hyperlipidemia, unspecified: Secondary | ICD-10-CM | POA: Diagnosis not present

## 2020-05-11 DIAGNOSIS — Z79899 Other long term (current) drug therapy: Secondary | ICD-10-CM | POA: Diagnosis not present

## 2020-05-11 DIAGNOSIS — E039 Hypothyroidism, unspecified: Secondary | ICD-10-CM | POA: Diagnosis not present

## 2020-05-18 DIAGNOSIS — N3281 Overactive bladder: Secondary | ICD-10-CM | POA: Diagnosis not present

## 2020-05-18 DIAGNOSIS — E039 Hypothyroidism, unspecified: Secondary | ICD-10-CM | POA: Diagnosis not present

## 2020-05-18 DIAGNOSIS — E559 Vitamin D deficiency, unspecified: Secondary | ICD-10-CM | POA: Diagnosis not present

## 2020-05-18 DIAGNOSIS — F419 Anxiety disorder, unspecified: Secondary | ICD-10-CM | POA: Diagnosis not present

## 2020-05-18 DIAGNOSIS — N898 Other specified noninflammatory disorders of vagina: Secondary | ICD-10-CM | POA: Diagnosis not present

## 2020-05-18 DIAGNOSIS — I10 Essential (primary) hypertension: Secondary | ICD-10-CM | POA: Diagnosis not present

## 2020-05-18 DIAGNOSIS — E785 Hyperlipidemia, unspecified: Secondary | ICD-10-CM | POA: Diagnosis not present

## 2020-05-18 DIAGNOSIS — R42 Dizziness and giddiness: Secondary | ICD-10-CM | POA: Diagnosis not present

## 2020-05-18 DIAGNOSIS — K219 Gastro-esophageal reflux disease without esophagitis: Secondary | ICD-10-CM | POA: Diagnosis not present

## 2020-08-24 ENCOUNTER — Ambulatory Visit: Payer: Self-pay | Admitting: Podiatry

## 2020-12-31 ENCOUNTER — Other Ambulatory Visit: Payer: Self-pay | Admitting: Family Medicine

## 2020-12-31 DIAGNOSIS — Z1231 Encounter for screening mammogram for malignant neoplasm of breast: Secondary | ICD-10-CM

## 2021-01-26 DIAGNOSIS — H18413 Arcus senilis, bilateral: Secondary | ICD-10-CM | POA: Diagnosis not present

## 2021-01-26 DIAGNOSIS — Z961 Presence of intraocular lens: Secondary | ICD-10-CM | POA: Diagnosis not present

## 2021-01-26 DIAGNOSIS — H02831 Dermatochalasis of right upper eyelid: Secondary | ICD-10-CM | POA: Diagnosis not present

## 2021-01-26 DIAGNOSIS — Z23 Encounter for immunization: Secondary | ICD-10-CM | POA: Diagnosis not present

## 2021-01-26 DIAGNOSIS — H26491 Other secondary cataract, right eye: Secondary | ICD-10-CM | POA: Diagnosis not present

## 2021-01-28 ENCOUNTER — Ambulatory Visit: Payer: Medicare PPO

## 2021-02-05 ENCOUNTER — Ambulatory Visit
Admission: RE | Admit: 2021-02-05 | Discharge: 2021-02-05 | Disposition: A | Discharge status: Home / Self Care | Payer: Medicare PPO | Source: Ambulatory Visit | Attending: Family Medicine | Admitting: Family Medicine

## 2021-02-05 ENCOUNTER — Other Ambulatory Visit: Payer: Self-pay

## 2021-02-05 DIAGNOSIS — Z1231 Encounter for screening mammogram for malignant neoplasm of breast: Secondary | ICD-10-CM | POA: Diagnosis not present

## 2021-03-02 ENCOUNTER — Ambulatory Visit: Payer: Medicare PPO

## 2021-03-08 DIAGNOSIS — R350 Frequency of micturition: Secondary | ICD-10-CM | POA: Diagnosis not present

## 2021-03-08 DIAGNOSIS — N898 Other specified noninflammatory disorders of vagina: Secondary | ICD-10-CM | POA: Diagnosis not present

## 2021-03-08 DIAGNOSIS — R300 Dysuria: Secondary | ICD-10-CM | POA: Diagnosis not present

## 2021-04-07 DIAGNOSIS — E039 Hypothyroidism, unspecified: Secondary | ICD-10-CM | POA: Diagnosis not present

## 2021-04-07 DIAGNOSIS — R7303 Prediabetes: Secondary | ICD-10-CM | POA: Diagnosis not present

## 2021-04-07 DIAGNOSIS — I10 Essential (primary) hypertension: Secondary | ICD-10-CM | POA: Diagnosis not present

## 2021-04-07 DIAGNOSIS — E7800 Pure hypercholesterolemia, unspecified: Secondary | ICD-10-CM | POA: Diagnosis not present

## 2021-04-14 DIAGNOSIS — Z79899 Other long term (current) drug therapy: Secondary | ICD-10-CM | POA: Diagnosis not present

## 2021-04-14 DIAGNOSIS — N3281 Overactive bladder: Secondary | ICD-10-CM | POA: Diagnosis not present

## 2021-04-14 DIAGNOSIS — I10 Essential (primary) hypertension: Secondary | ICD-10-CM | POA: Diagnosis not present

## 2021-04-14 DIAGNOSIS — F419 Anxiety disorder, unspecified: Secondary | ICD-10-CM | POA: Diagnosis not present

## 2021-04-14 DIAGNOSIS — G629 Polyneuropathy, unspecified: Secondary | ICD-10-CM | POA: Diagnosis not present

## 2021-04-14 DIAGNOSIS — R7303 Prediabetes: Secondary | ICD-10-CM | POA: Diagnosis not present

## 2021-04-14 DIAGNOSIS — Z87891 Personal history of nicotine dependence: Secondary | ICD-10-CM | POA: Diagnosis not present

## 2021-04-14 DIAGNOSIS — E039 Hypothyroidism, unspecified: Secondary | ICD-10-CM | POA: Diagnosis not present

## 2021-04-14 DIAGNOSIS — E785 Hyperlipidemia, unspecified: Secondary | ICD-10-CM | POA: Diagnosis not present

## 2021-05-03 ENCOUNTER — Ambulatory Visit: Payer: Medicare PPO | Admitting: Podiatry

## 2021-05-03 ENCOUNTER — Other Ambulatory Visit: Payer: Self-pay

## 2021-05-03 ENCOUNTER — Encounter: Payer: Self-pay | Admitting: Podiatry

## 2021-05-03 DIAGNOSIS — M79675 Pain in left toe(s): Secondary | ICD-10-CM | POA: Diagnosis not present

## 2021-05-03 DIAGNOSIS — M79674 Pain in right toe(s): Secondary | ICD-10-CM | POA: Diagnosis not present

## 2021-05-03 DIAGNOSIS — B351 Tinea unguium: Secondary | ICD-10-CM

## 2021-05-03 NOTE — Progress Notes (Signed)
°  Subjective:  Patient ID: Kimberly Rosario, female    DOB: 1945-11-18,   MRN: [id number]  Chief Complaint  Patient presents with   Nail Problem    Nail trim     76 y.o. female presents for concern of thickened elongated and painful nails that are difficult to trim. Requesting to have them trimmed today. She does get numbness in her feet. Has a history of back problems. She is not diabetic.    Marland Kitchen Denies any other pedal complaints. Denies n/v/f/c.   Past Medical History:  Diagnosis Date   Acrocyanosis ([place]) 11/06/2018   Atypical chest pain 11/06/2018   GERD (gastroesophageal reflux disease) 11/06/2018   Hypercholesterolemia 11/06/2018   Hypothyroidism 11/06/2018    Objective:  Physical Exam: Vascular: DP/PT pulses 2/4 bilateral. CFT <3 seconds. Normal hair growth on digits. No edema.  Skin. No lacerations or abrasions bilateral feet. Nails 1-5 are thickened discolored and elongated with subungual debris.  Musculoskeletal: MMT 5/5 bilateral lower extremities in DF, PF, Inversion and Eversion. Deceased ROM in DF of ankle joint.  Neurological: Sensation intact to light touch.   Assessment:  No diagnosis found.   Plan:  Patient was evaluated and treated and all questions answered. -Discussed and educated patient on  foot care, especially with  regards to the vascular, neurological and musculoskeletal systems.  -Discussed supportive shoes at all times and checking feet regularly.  -Mechanically debrided all nails 1-5 bilateral using sterile nail nipper and filed with dremel without incident as courtesy  -Answered all patient questions -Patient to return as needed.  -Patient advised to call the office if any problems or questions arise in the meantime.   Lorenda Peck, DPM

## 2021-07-14 DIAGNOSIS — R350 Frequency of micturition: Secondary | ICD-10-CM | POA: Diagnosis not present

## 2021-07-14 DIAGNOSIS — Z8719 Personal history of other diseases of the digestive system: Secondary | ICD-10-CM | POA: Diagnosis not present

## 2021-09-14 DIAGNOSIS — B028 Zoster with other complications: Secondary | ICD-10-CM | POA: Diagnosis not present

## 2021-09-14 DIAGNOSIS — M792 Neuralgia and neuritis, unspecified: Secondary | ICD-10-CM | POA: Diagnosis not present

## 2021-09-30 DIAGNOSIS — N3281 Overactive bladder: Secondary | ICD-10-CM | POA: Diagnosis not present

## 2021-09-30 DIAGNOSIS — F419 Anxiety disorder, unspecified: Secondary | ICD-10-CM | POA: Diagnosis not present

## 2021-09-30 DIAGNOSIS — E785 Hyperlipidemia, unspecified: Secondary | ICD-10-CM | POA: Diagnosis not present

## 2021-09-30 DIAGNOSIS — Z79899 Other long term (current) drug therapy: Secondary | ICD-10-CM | POA: Diagnosis not present

## 2021-09-30 DIAGNOSIS — R7303 Prediabetes: Secondary | ICD-10-CM | POA: Diagnosis not present

## 2021-09-30 DIAGNOSIS — E039 Hypothyroidism, unspecified: Secondary | ICD-10-CM | POA: Diagnosis not present

## 2021-10-13 DIAGNOSIS — K219 Gastro-esophageal reflux disease without esophagitis: Secondary | ICD-10-CM | POA: Diagnosis not present

## 2021-10-13 DIAGNOSIS — I739 Peripheral vascular disease, unspecified: Secondary | ICD-10-CM | POA: Diagnosis not present

## 2021-10-13 DIAGNOSIS — N3281 Overactive bladder: Secondary | ICD-10-CM | POA: Diagnosis not present

## 2021-10-13 DIAGNOSIS — Z0000 Encounter for general adult medical examination without abnormal findings: Secondary | ICD-10-CM | POA: Diagnosis not present

## 2021-10-13 DIAGNOSIS — Z23 Encounter for immunization: Secondary | ICD-10-CM | POA: Diagnosis not present

## 2021-10-13 DIAGNOSIS — R7303 Prediabetes: Secondary | ICD-10-CM | POA: Diagnosis not present

## 2021-10-13 DIAGNOSIS — E7800 Pure hypercholesterolemia, unspecified: Secondary | ICD-10-CM | POA: Diagnosis not present

## 2021-10-13 DIAGNOSIS — F419 Anxiety disorder, unspecified: Secondary | ICD-10-CM | POA: Diagnosis not present

## 2021-10-13 DIAGNOSIS — I10 Essential (primary) hypertension: Secondary | ICD-10-CM | POA: Diagnosis not present

## 2021-10-13 DIAGNOSIS — J449 Chronic obstructive pulmonary disease, unspecified: Secondary | ICD-10-CM | POA: Diagnosis not present

## 2021-10-13 DIAGNOSIS — E785 Hyperlipidemia, unspecified: Secondary | ICD-10-CM | POA: Diagnosis not present

## 2021-10-13 DIAGNOSIS — Z79899 Other long term (current) drug therapy: Secondary | ICD-10-CM | POA: Diagnosis not present

## 2022-04-13 DIAGNOSIS — E559 Vitamin D deficiency, unspecified: Secondary | ICD-10-CM | POA: Diagnosis not present

## 2022-04-13 DIAGNOSIS — N3281 Overactive bladder: Secondary | ICD-10-CM | POA: Diagnosis not present

## 2022-04-13 DIAGNOSIS — I10 Essential (primary) hypertension: Secondary | ICD-10-CM | POA: Diagnosis not present

## 2022-04-13 DIAGNOSIS — Z0000 Encounter for general adult medical examination without abnormal findings: Secondary | ICD-10-CM | POA: Diagnosis not present

## 2022-04-13 DIAGNOSIS — E7800 Pure hypercholesterolemia, unspecified: Secondary | ICD-10-CM | POA: Diagnosis not present

## 2022-04-13 DIAGNOSIS — E039 Hypothyroidism, unspecified: Secondary | ICD-10-CM | POA: Diagnosis not present

## 2022-04-20 DIAGNOSIS — E039 Hypothyroidism, unspecified: Secondary | ICD-10-CM | POA: Diagnosis not present

## 2022-04-20 DIAGNOSIS — N390 Urinary tract infection, site not specified: Secondary | ICD-10-CM | POA: Diagnosis not present

## 2022-04-20 DIAGNOSIS — I739 Peripheral vascular disease, unspecified: Secondary | ICD-10-CM | POA: Diagnosis not present

## 2022-04-20 DIAGNOSIS — I10 Essential (primary) hypertension: Secondary | ICD-10-CM | POA: Diagnosis not present

## 2022-04-20 DIAGNOSIS — K219 Gastro-esophageal reflux disease without esophagitis: Secondary | ICD-10-CM | POA: Diagnosis not present

## 2022-04-20 DIAGNOSIS — E559 Vitamin D deficiency, unspecified: Secondary | ICD-10-CM | POA: Diagnosis not present

## 2022-04-20 DIAGNOSIS — F419 Anxiety disorder, unspecified: Secondary | ICD-10-CM | POA: Diagnosis not present

## 2022-04-20 DIAGNOSIS — E7800 Pure hypercholesterolemia, unspecified: Secondary | ICD-10-CM | POA: Diagnosis not present

## 2022-04-20 DIAGNOSIS — N3281 Overactive bladder: Secondary | ICD-10-CM | POA: Diagnosis not present

## 2022-09-08 DIAGNOSIS — R350 Frequency of micturition: Secondary | ICD-10-CM | POA: Diagnosis not present

## 2022-09-08 DIAGNOSIS — R52 Pain, unspecified: Secondary | ICD-10-CM | POA: Diagnosis not present

## 2022-09-08 DIAGNOSIS — R109 Unspecified abdominal pain: Secondary | ICD-10-CM | POA: Diagnosis not present

## 2022-11-17 DIAGNOSIS — N3000 Acute cystitis without hematuria: Secondary | ICD-10-CM | POA: Diagnosis not present

## 2022-11-17 DIAGNOSIS — R6883 Chills (without fever): Secondary | ICD-10-CM | POA: Diagnosis not present

## 2022-11-17 DIAGNOSIS — R0602 Shortness of breath: Secondary | ICD-10-CM | POA: Diagnosis not present

## 2022-11-22 DIAGNOSIS — R351 Nocturia: Secondary | ICD-10-CM | POA: Diagnosis not present

## 2022-11-22 DIAGNOSIS — N390 Urinary tract infection, site not specified: Secondary | ICD-10-CM | POA: Diagnosis not present

## 2022-11-22 DIAGNOSIS — R350 Frequency of micturition: Secondary | ICD-10-CM | POA: Diagnosis not present

## 2022-11-28 DIAGNOSIS — L570 Actinic keratosis: Secondary | ICD-10-CM | POA: Diagnosis not present

## 2022-11-28 DIAGNOSIS — L821 Other seborrheic keratosis: Secondary | ICD-10-CM | POA: Diagnosis not present

## 2022-11-28 DIAGNOSIS — D485 Neoplasm of uncertain behavior of skin: Secondary | ICD-10-CM | POA: Diagnosis not present

## 2022-11-28 DIAGNOSIS — X32XXXA Exposure to sunlight, initial encounter: Secondary | ICD-10-CM | POA: Diagnosis not present

## 2022-11-28 DIAGNOSIS — C44311 Basal cell carcinoma of skin of nose: Secondary | ICD-10-CM | POA: Diagnosis not present

## 2022-11-29 DIAGNOSIS — E039 Hypothyroidism, unspecified: Secondary | ICD-10-CM | POA: Diagnosis not present

## 2022-11-29 DIAGNOSIS — E559 Vitamin D deficiency, unspecified: Secondary | ICD-10-CM | POA: Diagnosis not present

## 2022-11-29 DIAGNOSIS — N390 Urinary tract infection, site not specified: Secondary | ICD-10-CM | POA: Diagnosis not present

## 2022-11-29 DIAGNOSIS — I10 Essential (primary) hypertension: Secondary | ICD-10-CM | POA: Diagnosis not present

## 2022-11-29 DIAGNOSIS — E7800 Pure hypercholesterolemia, unspecified: Secondary | ICD-10-CM | POA: Diagnosis not present

## 2022-12-13 DIAGNOSIS — Z0000 Encounter for general adult medical examination without abnormal findings: Secondary | ICD-10-CM | POA: Diagnosis not present

## 2022-12-13 DIAGNOSIS — K219 Gastro-esophageal reflux disease without esophagitis: Secondary | ICD-10-CM | POA: Diagnosis not present

## 2022-12-13 DIAGNOSIS — E7800 Pure hypercholesterolemia, unspecified: Secondary | ICD-10-CM | POA: Diagnosis not present

## 2022-12-13 DIAGNOSIS — E039 Hypothyroidism, unspecified: Secondary | ICD-10-CM | POA: Diagnosis not present

## 2022-12-13 DIAGNOSIS — I10 Essential (primary) hypertension: Secondary | ICD-10-CM | POA: Diagnosis not present

## 2022-12-13 DIAGNOSIS — E559 Vitamin D deficiency, unspecified: Secondary | ICD-10-CM | POA: Diagnosis not present

## 2022-12-13 DIAGNOSIS — F419 Anxiety disorder, unspecified: Secondary | ICD-10-CM | POA: Diagnosis not present

## 2022-12-13 DIAGNOSIS — N3281 Overactive bladder: Secondary | ICD-10-CM | POA: Diagnosis not present

## 2022-12-13 DIAGNOSIS — I739 Peripheral vascular disease, unspecified: Secondary | ICD-10-CM | POA: Diagnosis not present

## 2023-03-17 DIAGNOSIS — R051 Acute cough: Secondary | ICD-10-CM | POA: Diagnosis not present

## 2023-03-17 DIAGNOSIS — J040 Acute laryngitis: Secondary | ICD-10-CM | POA: Diagnosis not present

## 2023-03-17 DIAGNOSIS — R509 Fever, unspecified: Secondary | ICD-10-CM | POA: Diagnosis not present

## 2023-06-05 DIAGNOSIS — M810 Age-related osteoporosis without current pathological fracture: Secondary | ICD-10-CM | POA: Diagnosis not present

## 2023-06-05 DIAGNOSIS — I10 Essential (primary) hypertension: Secondary | ICD-10-CM | POA: Diagnosis not present

## 2023-06-05 DIAGNOSIS — E039 Hypothyroidism, unspecified: Secondary | ICD-10-CM | POA: Diagnosis not present

## 2023-06-05 DIAGNOSIS — E7800 Pure hypercholesterolemia, unspecified: Secondary | ICD-10-CM | POA: Diagnosis not present

## 2023-06-05 DIAGNOSIS — N3281 Overactive bladder: Secondary | ICD-10-CM | POA: Diagnosis not present

## 2023-06-05 DIAGNOSIS — N390 Urinary tract infection, site not specified: Secondary | ICD-10-CM | POA: Diagnosis not present

## 2023-06-05 DIAGNOSIS — E559 Vitamin D deficiency, unspecified: Secondary | ICD-10-CM | POA: Diagnosis not present

## 2023-06-12 DIAGNOSIS — E039 Hypothyroidism, unspecified: Secondary | ICD-10-CM | POA: Diagnosis not present

## 2023-06-12 DIAGNOSIS — E559 Vitamin D deficiency, unspecified: Secondary | ICD-10-CM | POA: Diagnosis not present

## 2023-06-12 DIAGNOSIS — I739 Peripheral vascular disease, unspecified: Secondary | ICD-10-CM | POA: Diagnosis not present

## 2023-06-12 DIAGNOSIS — I10 Essential (primary) hypertension: Secondary | ICD-10-CM | POA: Diagnosis not present

## 2023-06-12 DIAGNOSIS — K219 Gastro-esophageal reflux disease without esophagitis: Secondary | ICD-10-CM | POA: Diagnosis not present

## 2023-06-12 DIAGNOSIS — F419 Anxiety disorder, unspecified: Secondary | ICD-10-CM | POA: Diagnosis not present

## 2023-06-12 DIAGNOSIS — E7800 Pure hypercholesterolemia, unspecified: Secondary | ICD-10-CM | POA: Diagnosis not present

## 2023-06-12 DIAGNOSIS — N3281 Overactive bladder: Secondary | ICD-10-CM | POA: Diagnosis not present

## 2023-06-12 DIAGNOSIS — N390 Urinary tract infection, site not specified: Secondary | ICD-10-CM | POA: Diagnosis not present

## 2023-10-26 DIAGNOSIS — M810 Age-related osteoporosis without current pathological fracture: Secondary | ICD-10-CM | POA: Diagnosis not present

## 2023-12-04 DIAGNOSIS — E039 Hypothyroidism, unspecified: Secondary | ICD-10-CM | POA: Diagnosis not present

## 2023-12-04 DIAGNOSIS — E559 Vitamin D deficiency, unspecified: Secondary | ICD-10-CM | POA: Diagnosis not present

## 2023-12-04 DIAGNOSIS — E7800 Pure hypercholesterolemia, unspecified: Secondary | ICD-10-CM | POA: Diagnosis not present

## 2023-12-04 DIAGNOSIS — N390 Urinary tract infection, site not specified: Secondary | ICD-10-CM | POA: Diagnosis not present

## 2023-12-04 DIAGNOSIS — I10 Essential (primary) hypertension: Secondary | ICD-10-CM | POA: Diagnosis not present

## 2023-12-13 ENCOUNTER — Other Ambulatory Visit: Payer: Self-pay

## 2023-12-13 DIAGNOSIS — F419 Anxiety disorder, unspecified: Secondary | ICD-10-CM | POA: Diagnosis not present

## 2023-12-13 DIAGNOSIS — J432 Centrilobular emphysema: Secondary | ICD-10-CM | POA: Diagnosis not present

## 2023-12-13 DIAGNOSIS — N3281 Overactive bladder: Secondary | ICD-10-CM | POA: Diagnosis not present

## 2023-12-13 DIAGNOSIS — E7800 Pure hypercholesterolemia, unspecified: Secondary | ICD-10-CM | POA: Diagnosis not present

## 2023-12-13 DIAGNOSIS — E039 Hypothyroidism, unspecified: Secondary | ICD-10-CM | POA: Diagnosis not present

## 2023-12-13 DIAGNOSIS — I10 Essential (primary) hypertension: Secondary | ICD-10-CM | POA: Diagnosis not present

## 2023-12-13 DIAGNOSIS — Z1231 Encounter for screening mammogram for malignant neoplasm of breast: Secondary | ICD-10-CM

## 2023-12-13 DIAGNOSIS — I739 Peripheral vascular disease, unspecified: Secondary | ICD-10-CM | POA: Diagnosis not present

## 2023-12-13 DIAGNOSIS — E559 Vitamin D deficiency, unspecified: Secondary | ICD-10-CM | POA: Diagnosis not present

## 2023-12-13 DIAGNOSIS — Z0000 Encounter for general adult medical examination without abnormal findings: Secondary | ICD-10-CM | POA: Diagnosis not present

## 2023-12-27 ENCOUNTER — Ambulatory Visit
Admission: RE | Admit: 2023-12-27 | Discharge: 2023-12-27 | Disposition: A | Discharge status: Home / Self Care | Source: Ambulatory Visit

## 2023-12-27 DIAGNOSIS — Z1231 Encounter for screening mammogram for malignant neoplasm of breast: Secondary | ICD-10-CM

## 2024-01-01 ENCOUNTER — Other Ambulatory Visit: Payer: Self-pay

## 2024-01-01 DIAGNOSIS — R928 Other abnormal and inconclusive findings on diagnostic imaging of breast: Secondary | ICD-10-CM

## 2024-01-10 ENCOUNTER — Ambulatory Visit
Admission: RE | Admit: 2024-01-10 | Discharge: 2024-01-10 | Disposition: A | Discharge status: Home / Self Care | Source: Ambulatory Visit

## 2024-01-10 ENCOUNTER — Ambulatory Visit

## 2024-01-10 DIAGNOSIS — R928 Other abnormal and inconclusive findings on diagnostic imaging of breast: Secondary | ICD-10-CM

## 2024-02-10 DIAGNOSIS — N390 Urinary tract infection, site not specified: Secondary | ICD-10-CM | POA: Diagnosis not present
